# Patient Record
Sex: Female | Born: 1985 | Race: White | Hispanic: No | Marital: Married | Smoking: Never smoker
Health system: Southern US, Community
[De-identification: ages and names within clinical notes are randomized; demographics above are authoritative.]

## PROBLEM LIST (undated history)

## (undated) DIAGNOSIS — R768 Other specified abnormal immunological findings in serum: Secondary | ICD-10-CM

## (undated) HISTORY — DX: Other specified abnormal immunological findings in serum: R76.8

---
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
## 2006-05-26 ENCOUNTER — Emergency Department (HOSPITAL_COMMUNITY): Admission: EM | Admit: 2006-05-26 | Discharge: 2006-05-27 | Payer: Self-pay | Admitting: Emergency Medicine

## 2008-03-12 ENCOUNTER — Other Ambulatory Visit: Admission: RE | Admit: 2008-03-12 | Discharge: 2008-03-12 | Payer: Self-pay | Admitting: Obstetrics and Gynecology

## 2008-03-20 ENCOUNTER — Ambulatory Visit (HOSPITAL_COMMUNITY): Admission: RE | Admit: 2008-03-20 | Discharge: 2008-03-20 | Payer: Self-pay | Admitting: Obstetrics & Gynecology

## 2008-04-10 ENCOUNTER — Ambulatory Visit (HOSPITAL_COMMUNITY): Admission: RE | Admit: 2008-04-10 | Discharge: 2008-04-10 | Payer: Self-pay | Admitting: Obstetrics & Gynecology

## 2008-05-01 ENCOUNTER — Ambulatory Visit (HOSPITAL_COMMUNITY): Admission: RE | Admit: 2008-05-01 | Discharge: 2008-05-01 | Payer: Self-pay | Admitting: Obstetrics & Gynecology

## 2008-06-01 ENCOUNTER — Ambulatory Visit (HOSPITAL_COMMUNITY): Admission: RE | Admit: 2008-06-01 | Discharge: 2008-06-01 | Payer: Self-pay | Admitting: Obstetrics & Gynecology

## 2008-06-15 DIAGNOSIS — R768 Other specified abnormal immunological findings in serum: Secondary | ICD-10-CM

## 2008-06-15 HISTORY — DX: Other specified abnormal immunological findings in serum: R76.8

## 2008-09-22 ENCOUNTER — Inpatient Hospital Stay (HOSPITAL_COMMUNITY): Admission: AD | Admit: 2008-09-22 | Discharge: 2008-09-22 | Payer: Self-pay | Admitting: Obstetrics and Gynecology

## 2008-09-22 ENCOUNTER — Ambulatory Visit: Payer: Self-pay | Admitting: Advanced Practice Midwife

## 2008-10-07 ENCOUNTER — Ambulatory Visit: Payer: Self-pay | Admitting: Obstetrics and Gynecology

## 2008-10-07 ENCOUNTER — Encounter (HOSPITAL_COMMUNITY): Payer: Self-pay | Admitting: Obstetrics and Gynecology

## 2008-10-07 ENCOUNTER — Inpatient Hospital Stay (HOSPITAL_COMMUNITY): Admission: AD | Admit: 2008-10-07 | Discharge: 2008-10-09 | Payer: Self-pay | Admitting: Obstetrics and Gynecology

## 2009-06-23 ENCOUNTER — Emergency Department (HOSPITAL_COMMUNITY): Admission: EM | Admit: 2009-06-23 | Discharge: 2009-06-23 | Payer: Self-pay | Admitting: Emergency Medicine

## 2010-08-12 ENCOUNTER — Emergency Department (HOSPITAL_COMMUNITY): Payer: Self-pay

## 2010-08-12 ENCOUNTER — Encounter (HOSPITAL_COMMUNITY): Payer: Self-pay | Admitting: Radiology

## 2010-08-12 ENCOUNTER — Emergency Department (HOSPITAL_COMMUNITY)
Admission: EM | Admit: 2010-08-12 | Discharge: 2010-08-12 | Disposition: A | Discharge status: Home / Self Care | Payer: Self-pay | Attending: Emergency Medicine | Admitting: Emergency Medicine

## 2010-08-12 DIAGNOSIS — N83209 Unspecified ovarian cyst, unspecified side: Secondary | ICD-10-CM | POA: Insufficient documentation

## 2010-08-12 DIAGNOSIS — R109 Unspecified abdominal pain: Secondary | ICD-10-CM | POA: Insufficient documentation

## 2010-08-12 LAB — CBC
HCT: 36 % (ref 36.0–46.0)
Platelets: 271 10*3/uL (ref 150–400)
RBC: 4.53 MIL/uL (ref 3.87–5.11)
RDW: 13 % (ref 11.5–15.5)
WBC: 7.1 10*3/uL (ref 4.0–10.5)

## 2010-08-12 LAB — URINALYSIS, ROUTINE W REFLEX MICROSCOPIC
Ketones, ur: 15 mg/dL — AB
Leukocytes, UA: NEGATIVE
Nitrite: NEGATIVE
Urine Glucose, Fasting: NEGATIVE mg/dL
pH: 7 (ref 5.0–8.0)

## 2010-08-12 LAB — DIFFERENTIAL
Basophils Absolute: 0 10*3/uL (ref 0.0–0.1)
Basophils Relative: 0 % (ref 0–1)
Eosinophils Relative: 1 % (ref 0–5)
Lymphocytes Relative: 35 % (ref 12–46)
Lymphs Abs: 2.5 10*3/uL (ref 0.7–4.0)
Monocytes Relative: 7 % (ref 3–12)
Neutro Abs: 4 10*3/uL (ref 1.7–7.7)
Neutrophils Relative %: 57 % (ref 43–77)

## 2010-08-12 LAB — URINE MICROSCOPIC-ADD ON

## 2010-08-12 LAB — BASIC METABOLIC PANEL
BUN: 13 mg/dL (ref 6–23)
CO2: 22 mEq/L (ref 19–32)
Creatinine, Ser: 0.8 mg/dL (ref 0.4–1.2)
GFR calc Af Amer: >60 mL/min (ref >60)
Potassium: 3.2 mEq/L — ABNORMAL LOW (ref 3.5–5.1)

## 2010-08-12 MED ORDER — IOHEXOL 300 MG/ML  SOLN
100.0000 mL | Freq: Once | INTRAMUSCULAR | Status: AC | PRN
  Administered 2010-08-12: 100 mL via INTRAVENOUS

## 2010-08-13 IMAGING — US US OB DETAIL+14 WK
1 series · 18 of 28 positions shown · non-contrast
Comparison: none

OBSTETRICAL ULTRASOUND:
 This ultrasound was performed in The [HOSPITAL], and the [facility] report will be stored to [REDACTED] PACS.

[Series 1: us ob detail+14 wk · 90 acquisitions, 18 frames shown]
[im 1/90]
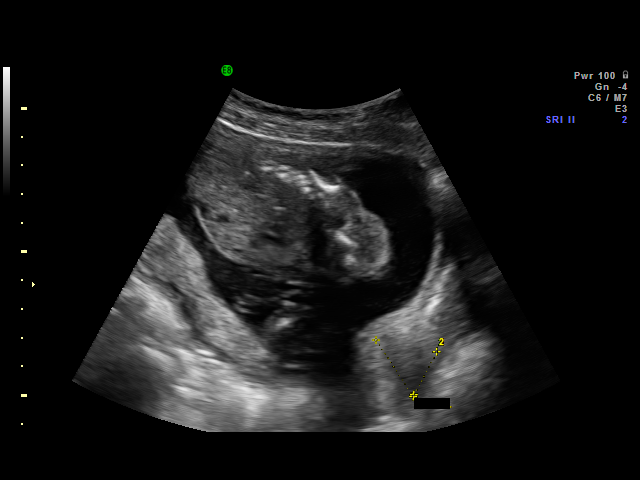
[im 7/90]
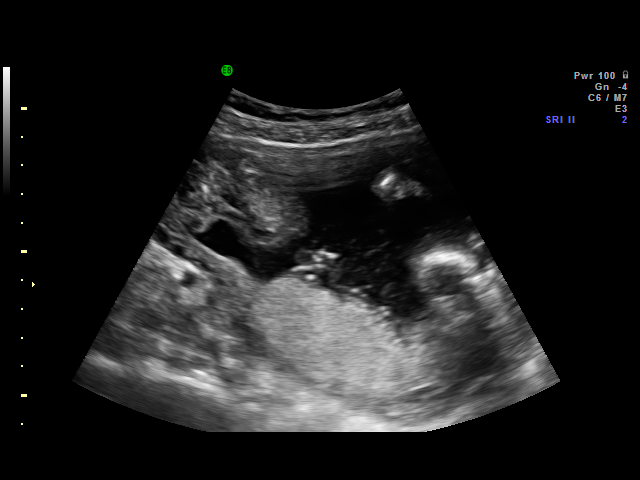
[im 10/90]
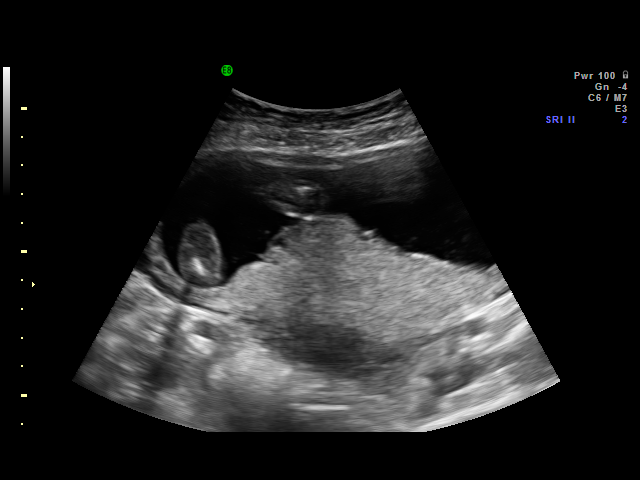
[im 17/90]
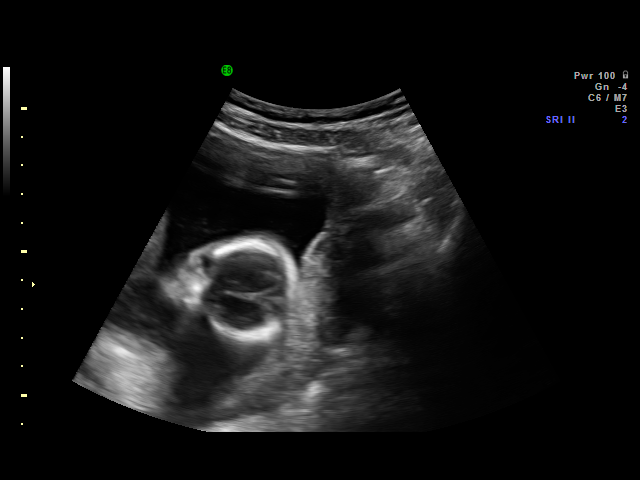
[im 24/90]
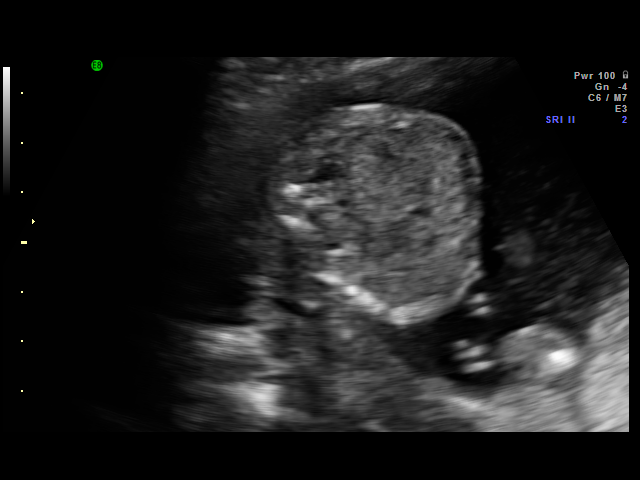
[im 27/90]
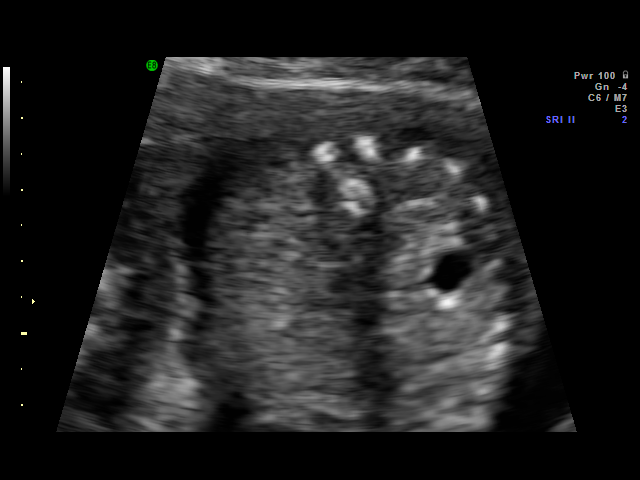
[im 33/90]
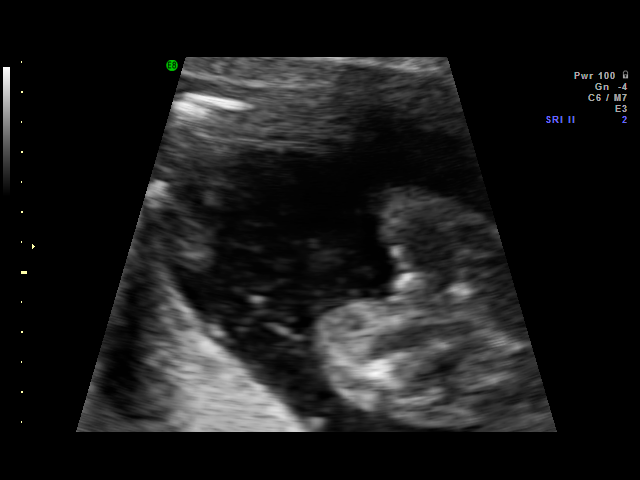
[im 37/90]
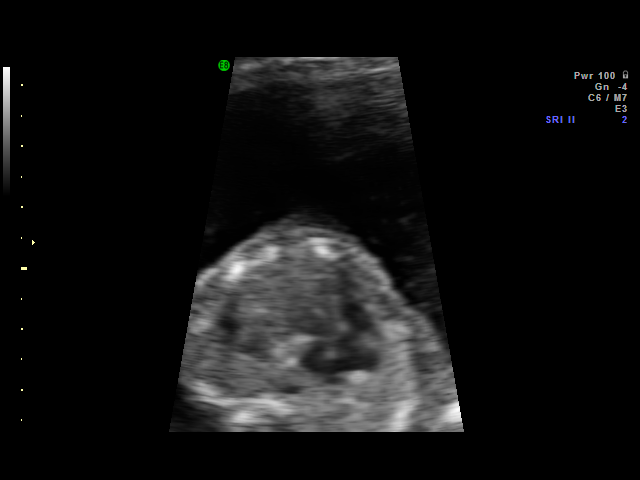
[im 43/90]
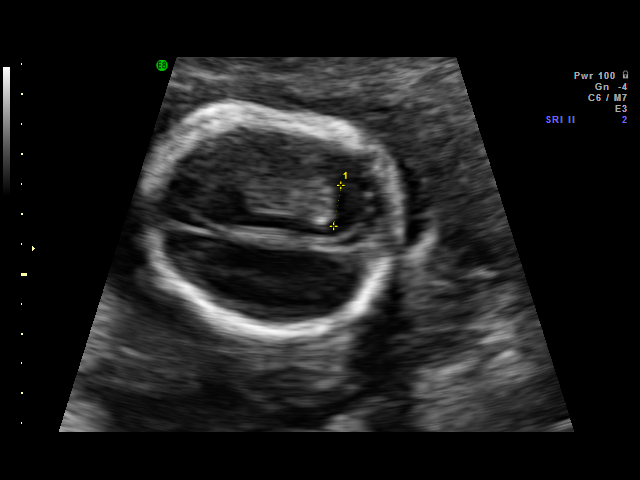
[im 47/90]
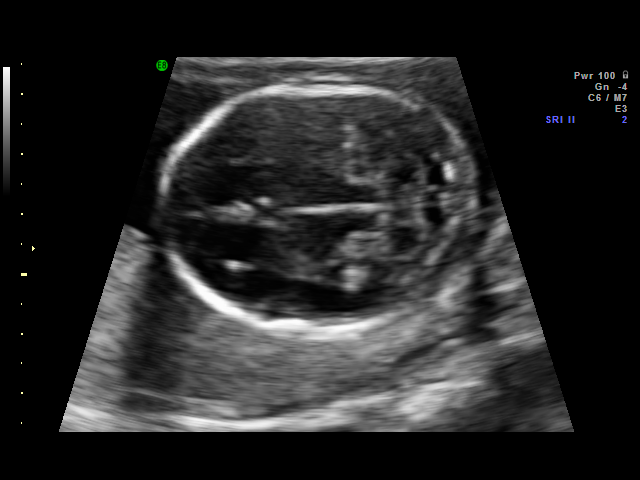
[im 53/90]
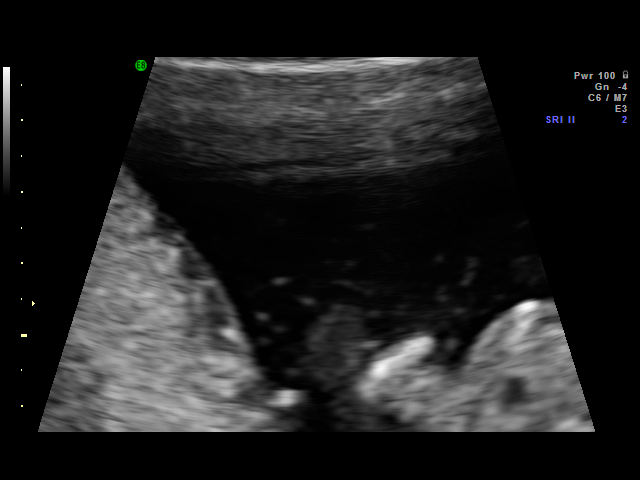
[im 57/90]
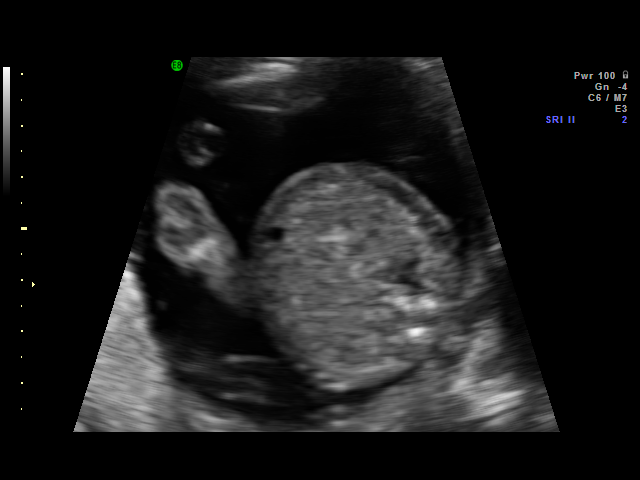
[im 63/90]
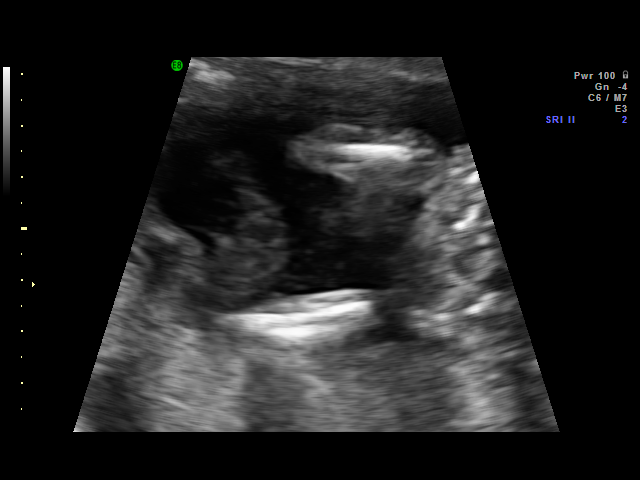
[im 70/90]
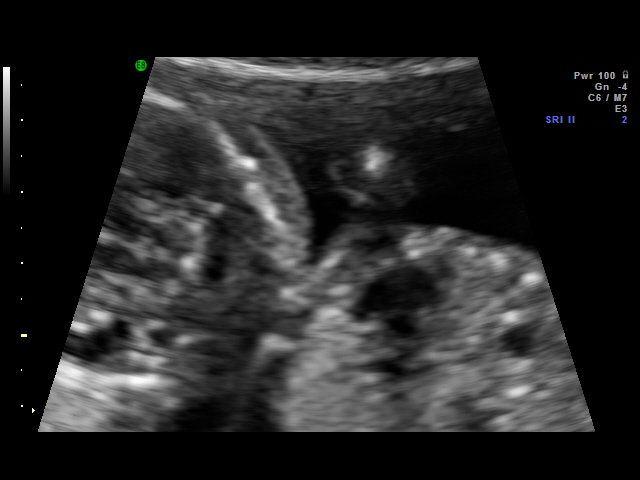
[im 73/90]
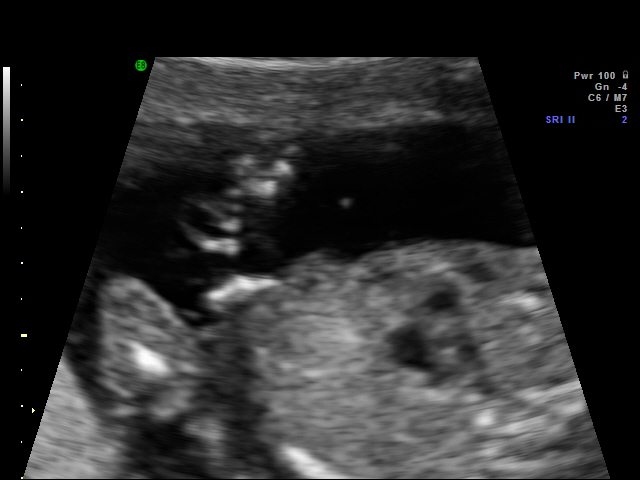
[im 80/90]
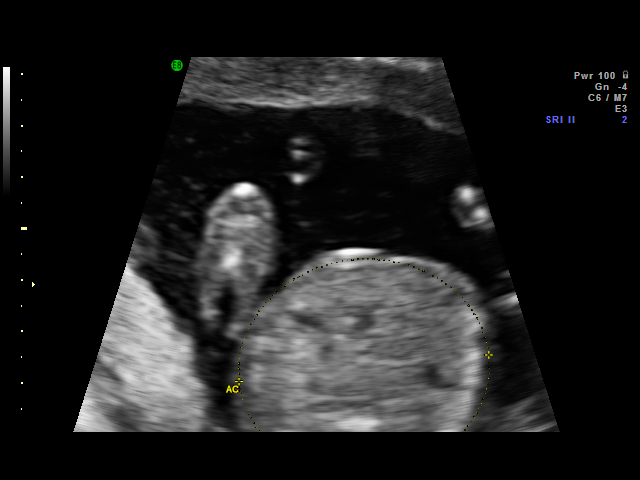
[im 83/90]
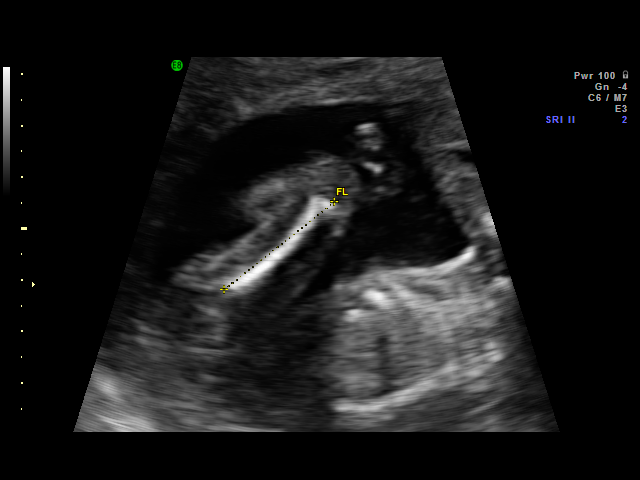
[im 90/90]
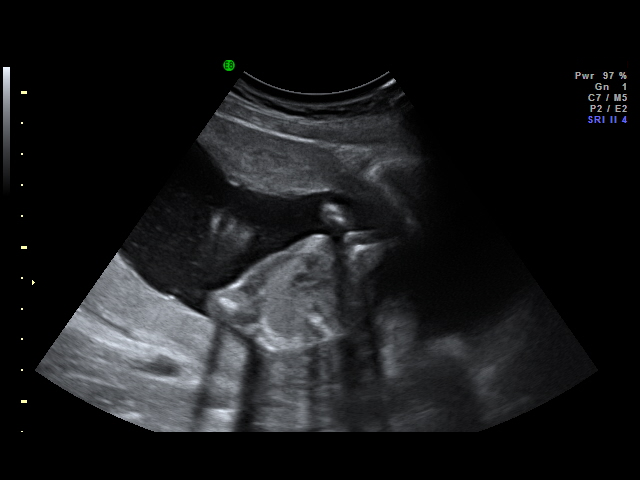

[18 of 28 positions shown; findings below may reference images not displayed]

IMPRESSION: [facility] has also been faxed to the ordering physician.

## 2010-08-31 LAB — RAPID STREP SCREEN (MED CTR MEBANE ONLY): Streptococcus, Group A Screen (Direct): NEGATIVE

## 2010-09-24 LAB — CBC
Hemoglobin: 10 g/dL — ABNORMAL LOW (ref 12.0–15.0)
MCHC: 34.1 g/dL (ref 30.0–36.0)
MCV: 78.3 fL (ref 78.0–100.0)
Platelets: 156 10*3/uL (ref 150–400)
Platelets: 190 10*3/uL (ref 150–400)
RBC: 3.08 MIL/uL — ABNORMAL LOW (ref 3.87–5.11)
WBC: 11.6 10*3/uL — ABNORMAL HIGH (ref 4.0–10.5)
WBC: 12.9 10*3/uL — ABNORMAL HIGH (ref 4.0–10.5)

## 2010-10-31 NOTE — Op Note (Signed)
Paula Sims, Paula Sims                        ACCOUNT NO.:  [id number]   MEDICAL RECORD NO.:  [id number]                   PATIENT TYPE:  AMB   LOCATION:  DAY                                  FACILITY:  [facility]   PHYSICIAN:  Oley Balm. Pricilla Holm, D.P.M.             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   PROCEDURE:  IP fusion second digits bilaterally.   SURGEON:  Oley Balm. Pricilla Holm, D.P.M.   ANESTHESIA:  Local, monitored anesthesia care   INDICATIONS FOR SURGERY:  Long-standing history of discomfort, inability to  wear enclosed shoes.   DESCRIPTION OF PROCEDURE:  The patient brought to the operating room and  placed on the operating table in the supine position  The patient's lower  feet and legs were then prepped and draped in the usual aseptic manner.  Then, with an ankle tourniquet placed and well-padded to prevent contusion;  elevated to 250 mmHg and after exsanguination of the each foot the following  surgical procedures were then performed under local, stand-by, anesthesia.   IP FUSION SECOND DIGIT RIGHT FOOT.  Attention was directed to the dorsal  aspect of the second digit where 2 semi-elliptical converting skin incisions  were made.  The incision was widened and deepened via sharp and blunt  dissection being sure to identify and retract all vital structures.  A  capsular incision made at the head of the phalanx __________ soft tissue  attachments dorsally, medially, laterally and plantarly.  Utilizing a Zimmer  oscillating saw the head of the proximal phalanx was fashioned into a female  component and a drill hole was drilled into the middle phalanx of the female  component.  The female component  was placed into the female component.  This  had the effect of fusing the IP joint as well as shortening the second toe.   A 0.045 K-wire was retrograded from distal to proximal.  It was noted that  the osteotomy and the fusion site was stable.  The wound was lavaged with a  copious amount of sterile saline.  The extensor tendon was reapproximated  utilizing 1 horizontal mattress suture of 4-0 Dexon.  The skin was  approximated utilizing horizontal mattress sutures of 4-0 Prolene.   IP FUSION WITH SHORTENING SECOND DIGIT LEFT FOOT.  A similar procedure that  was performed on the right foot was performed on the left foot with no  addition or deletions.   All surgical sites were then infiltrated with approximately 1/8 cc of  dexamethasone phosphate and a mild compressive bandage consisting of  Betadine soaked Adaptic, sterile 4 x 4's, and sterile Kling was applied.  The  patient tolerated the procedure well and left the operating room in  apparent good condition with vital signs stable to the recovery room.                                               Oley Balm Pricilla Holm, D.P.M.    DBT/MEDQ  D:  03/20/2002  T:  03/21/2002  Job:  [id number]

## 2010-10-31 NOTE — H&P (Signed)
   Paula Sims, Paula Sims                        ACCOUNT NO.:  [id number]   MEDICAL RECORD NO.:  [id number]                   PATIENT TYPE:  AMB   LOCATION:  DAY                                  FACILITY:  [facility]   PHYSICIAN:  Oley Balm. Pricilla Holm, D.P.M.             DATE OF BIRTH:  01-20-1986   DATE OF ADMISSION:  03/20/2002  DATE OF DISCHARGE:                                HISTORY & PHYSICAL   HISTORY OF PRESENT ILLNESS:  The patient is a 25 year old white female that  has had a longstanding history of pain from the distal ends of both of her  second toes.  The patient has a lesion on the distal end of each second  digit because of the length of the digit and of a mild hammertoe deformity.  She has difficulty wearing enclosed shoes and the mother requests surgical  correction of same.   PREVIOUS HOSPITALIZATIONS AND SURGERY:  None.   MEDICATIONS:  None.   ALLERGIES:  No allergies.   TRANSFUSIONS:  No transfusions or hepatitis.   REVIEW OF SYSTEMS:  Uneventful.   PHYSICAL EXAMINATION:  EXTREMITIES:  Lower extremity exam reveals palpable  pedal pulses, both DP and PT, with spontaneous capillary filling time.  NEUROLOGICAL:  Exam essentially within normal limits.  MUSCULOSKELETAL:  Exam reveals exceptionally long second digits bilaterally  with contraction at the IP joints and at the DIPJs.  There is a lesion on  the distal end of each digit.   ASSESSMENT:  Long digits with mild hammertoe deformity.   PLAN:  I reviewed both conservative and surgical management but again, the  patient's mother and the patient would like to have the problem surgically  corrected.  I reviewed with them IP fusion with shortening osteotomy.  I  reviewed the procedures, the complications of the procedure such as  infection, bone infection, postoperative pain, swelling, etc.  The patient  seems to understand the same and again, surgery has been scheduled for  March 20, 2002.                          Oley Balm Pricilla Holm, D.P.M.    DBT/MEDQ  D:  03/19/2002  T:  03/20/2002  Job:  [id number]

## 2011-03-22 ENCOUNTER — Other Ambulatory Visit: Payer: Self-pay | Admitting: Advanced Practice Midwife

## 2011-06-16 HISTORY — PX: FOOT SURGERY: SHX648

## 2011-07-31 ENCOUNTER — Other Ambulatory Visit: Payer: Self-pay | Admitting: Advanced Practice Midwife

## 2011-10-06 ENCOUNTER — Other Ambulatory Visit: Payer: Self-pay | Admitting: Obstetrics & Gynecology

## 2012-01-20 ENCOUNTER — Other Ambulatory Visit: Payer: Self-pay | Admitting: Obstetrics & Gynecology

## 2012-11-23 IMAGING — CT CT ABD-PELV W/ CM
2 of 3 series · 16 of 46 positions shown, 18 images · IV contrast (Omnipaque 300)
Comparison: None.

***ADDENDUM*** CREATED: 08/12/2010 [DATE]

On further review, there is a punctate calculus in the dependent
left bladder (series 2/image 68).  Focal right ureteral dilatation
is likely secondary to recent passage of this calculus.
***END ADDENDUM*** SIGNED BY: Armita Tobben, M.D.
CLINICAL DATA: Right lower quadrant abdominal pain starting last
night.
CT ABDOMEN AND PELVIS WITH CONTRAST
TECHNIQUE: Multidetector CT imaging of the abdomen and pelvis was
performed following the standard protocol during bolus
administration of intravenous contrast.
Contrast: 100 ml 2mnipaque-RLL IV

[Series 2: abd_pel_with 5.0 b40f · axial · 0.63mm/px · z∈[-443,-83]mm · 13 of 84 slices shown, 15 images]
[im 6/84  soft-tissue]
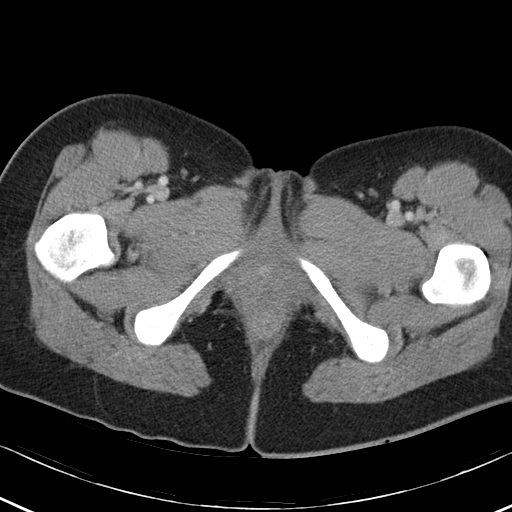
[im 6/84  bone]
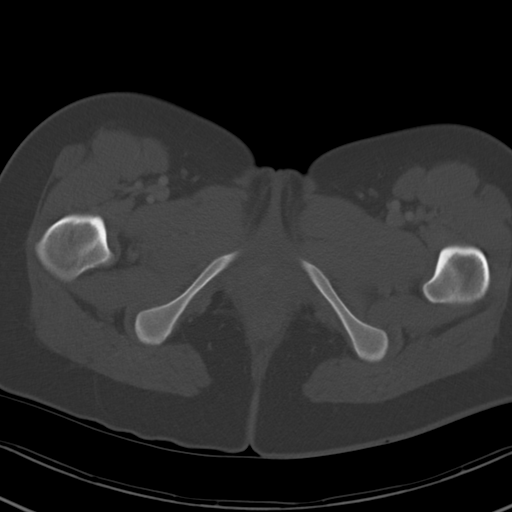
[im 11/84  soft-tissue]
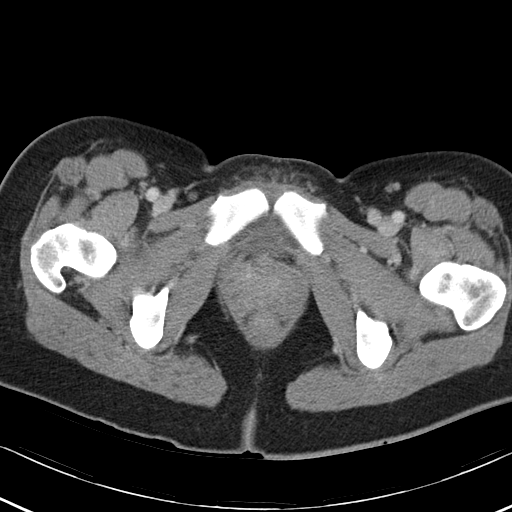
[im 17/84  soft-tissue]
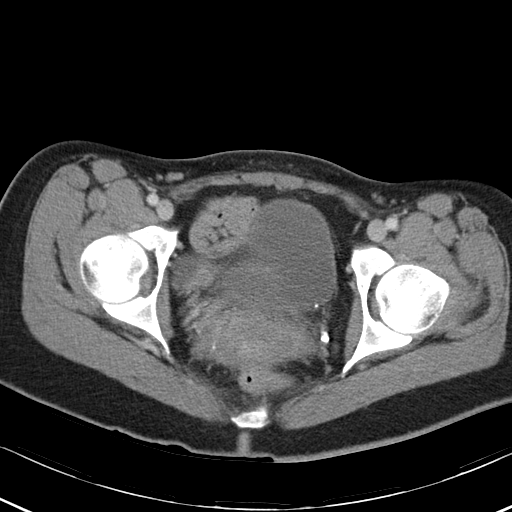
[im 25/84  soft-tissue]
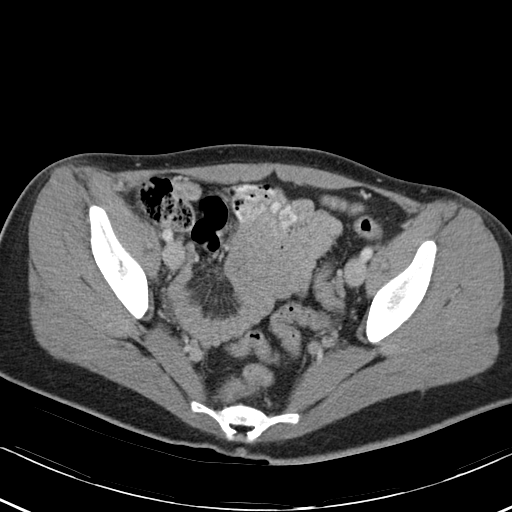
[im 30/84  soft-tissue]
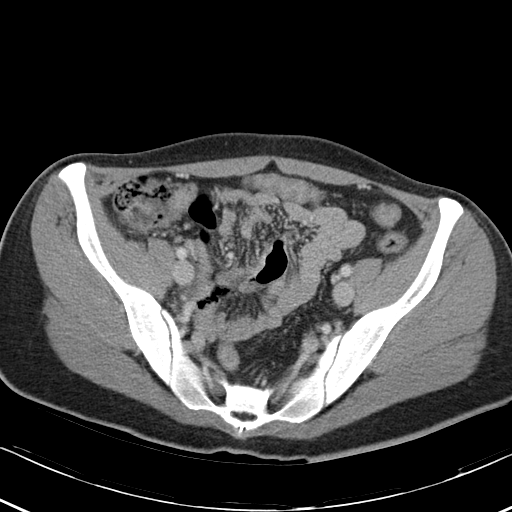
[im 35/84  soft-tissue]
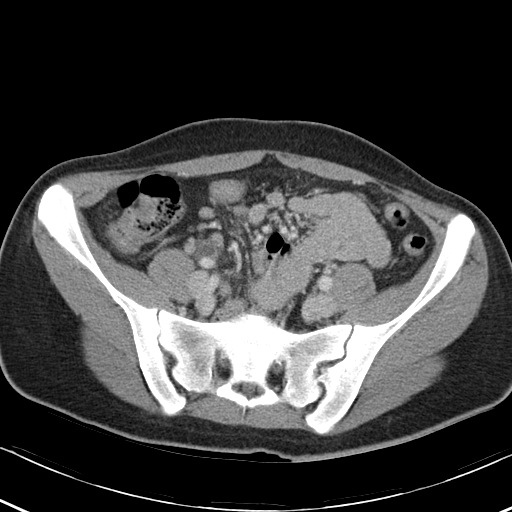
[im 43/84  soft-tissue]
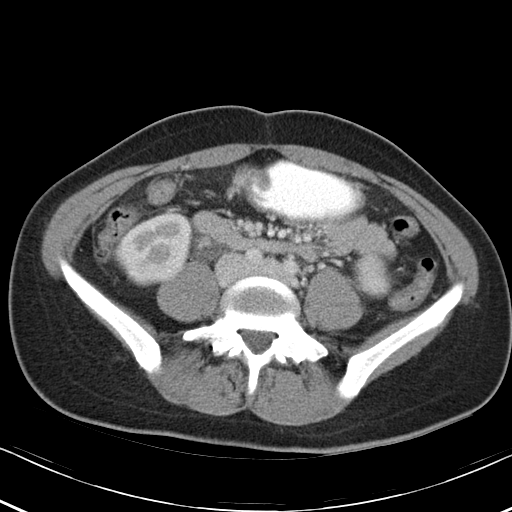
[im 49/84  soft-tissue]
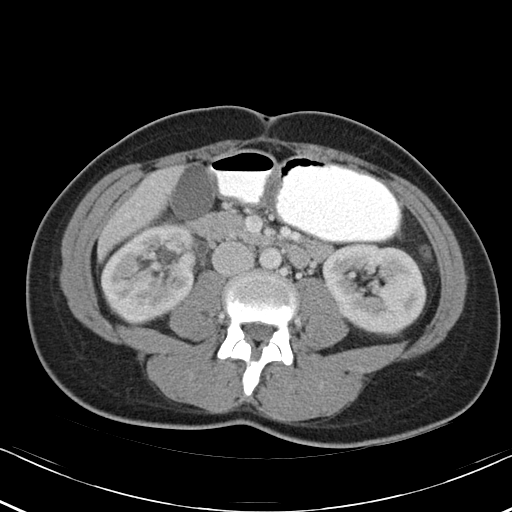
[im 54/84  soft-tissue]
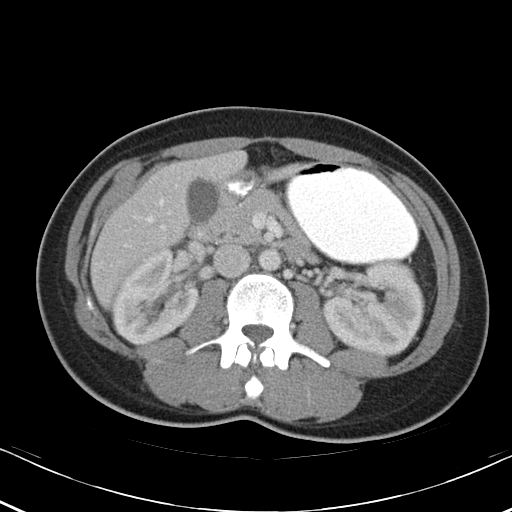
[im 54/84  bone]
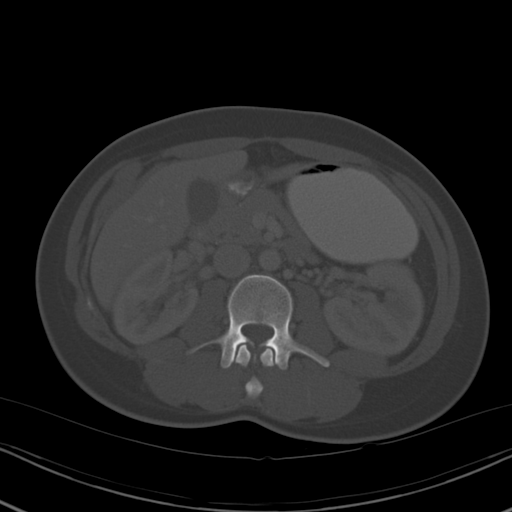
[im 59/84  soft-tissue]
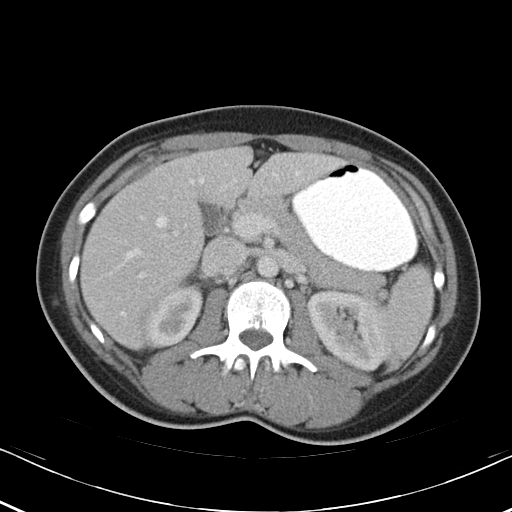
[im 67/84  soft-tissue]
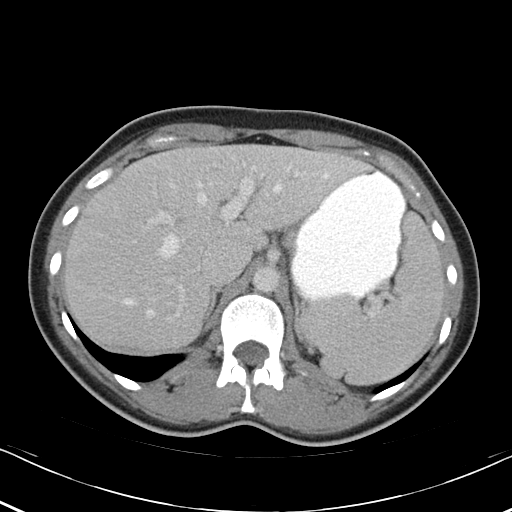
[im 73/84  soft-tissue]
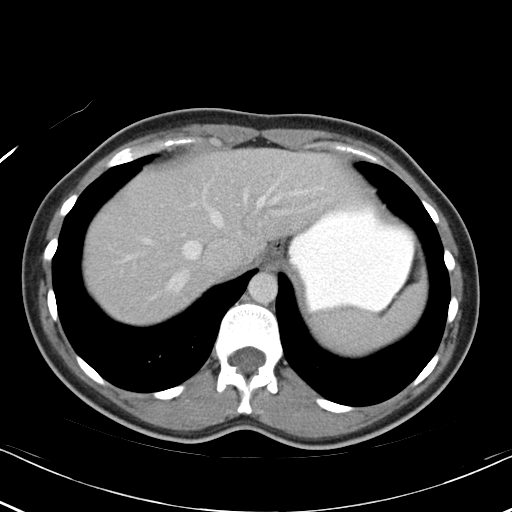
[im 78/84  soft-tissue]
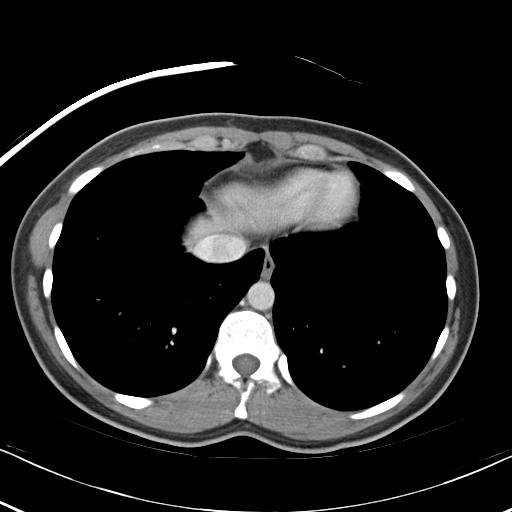

[Series 4: abd_pel_with 3.0 spo cor · coronal · 0.63mm/px · 3 of 76 slices shown]
[im 26/76  soft-tissue]
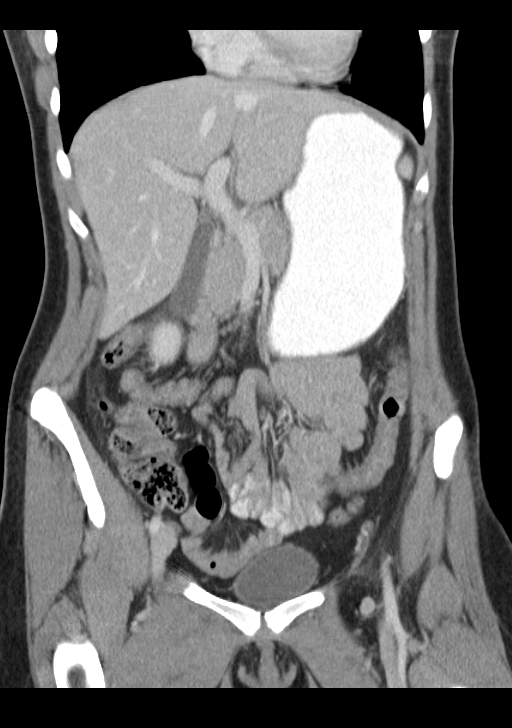
[im 34/76  soft-tissue]
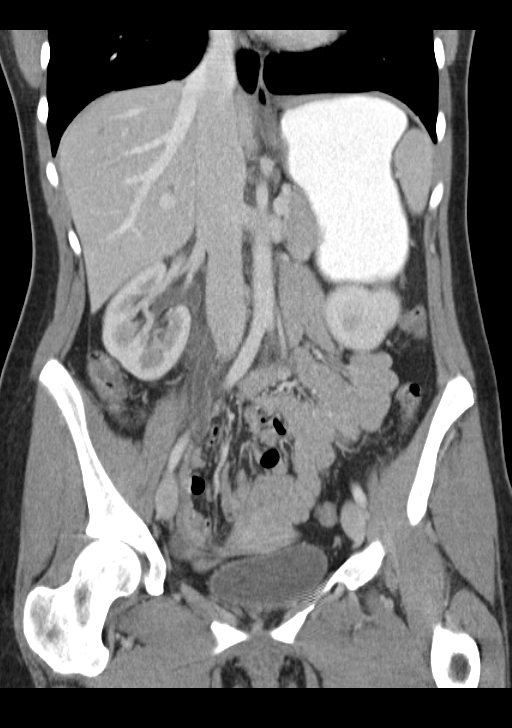
[im 42/76  soft-tissue]
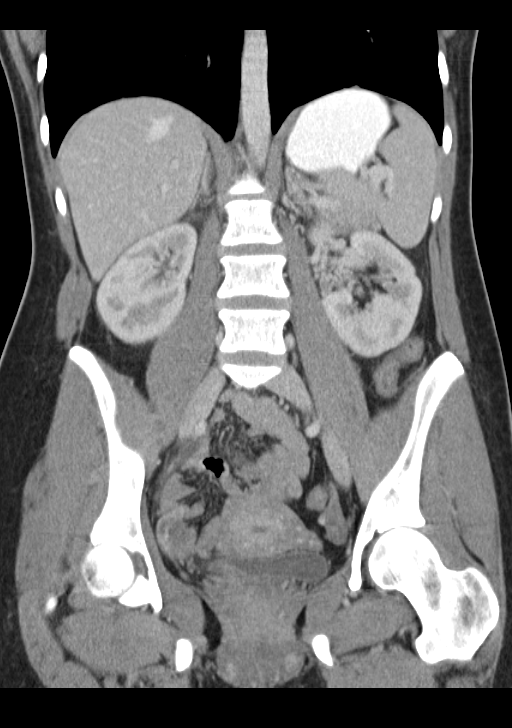

[16 of 46 positions shown; findings below may reference images not displayed]

FINDINGS: The lung bases are clear.

Focal fat along the falciform ligament.  No suspicious hepatic
lesions.  Spleen, pancreas, gallbladder, and bilateral adrenal
glands are within normal limits.

Bilateral kidneys are within normal limits, without hydronephrosis.

No evidence of bowel obstruction. Normal appendix.

Small amount of dependent pelvic fluid, likely physiologic.

Uterus and bilateral ovaries are within normal limits, noting a
cm right ovarian cyst, likely physiologic.

Mild focal dilatation of the mid right ureter (series 2/image 47),
without evidence of calculi or obstructing lesion. Bladder is
unremarkable.

Visualized osseous structures are within normal limits, noting
partial sacralization of the left L5 vertebral body.
IMPRESSION: Normal appendix. Normal gallbladder.  No evidence of obstruction.

No CT findings to account for the patient's abdominal pain.

## 2013-04-26 ENCOUNTER — Telehealth: Payer: Self-pay | Admitting: Advanced Practice Midwife

## 2013-04-27 ENCOUNTER — Encounter: Payer: Self-pay | Admitting: Advanced Practice Midwife

## 2013-04-27 ENCOUNTER — Other Ambulatory Visit: Payer: Self-pay | Admitting: Advanced Practice Midwife

## 2013-04-27 DIAGNOSIS — N979 Female infertility, unspecified: Secondary | ICD-10-CM | POA: Insufficient documentation

## 2013-04-27 DIAGNOSIS — R768 Other specified abnormal immunological findings in serum: Secondary | ICD-10-CM | POA: Insufficient documentation

## 2013-04-27 MED ORDER — CLOMIPHENE CITRATE 50 MG PO TABS: 50.0000 mg | ORAL_TABLET | Freq: Every day | ORAL | Status: DC

## 2013-04-27 NOTE — Progress Notes (Signed)
Has been actively (using ovulation predictor tests) for 2 years to get pregnant.  Had a first trimester SAB 2/12.  Ovulates every month.  Discussed with Dr. Despina Hidden.  Will try Clomid.  Pt researched it and is aware of potential side effects and risks (multiples, mood swings, etc).  Clomid 50mg  PO day 3-7 of cycle.

## 2013-04-27 NOTE — Telephone Encounter (Signed)
Drenda Freeze took care of.

## 2013-06-15 HISTORY — PX: OTHER SURGICAL HISTORY: SHX169

## 2014-04-16 ENCOUNTER — Encounter: Payer: Self-pay | Admitting: Advanced Practice Midwife

## 2014-07-16 ENCOUNTER — Telehealth: Payer: Self-pay | Admitting: Advanced Practice Midwife

## 2014-07-16 NOTE — Telephone Encounter (Signed)
Pt states she had saw Jack QuartoFran Sims, CNM in the past for infertility was started on Clomid with no success, requesting a referral for a infertility specialist.

## 2014-07-18 ENCOUNTER — Telehealth: Payer: Self-pay | Admitting: Advanced Practice Midwife

## 2014-07-18 NOTE — Telephone Encounter (Signed)
[facility] [place].  [phone]

## 2015-06-16 NOTE — L&D Delivery Note (Signed)
Delivery Note After a 9  Minute 2nd stage, at 11:51 PM a viable female was delivered via Vaginal, Breech (Presentation: Right Occiput Anterior).  APGAR: , 9/9; weight pending  .   After 2 minutes, the cord was clamped and cut. 40 units of pitocin diluted in 1000cc LR was infused rapidly IV.  The placenta separated spontaneously and delivered via CCT and maternal pushing effort.  It was inspected and appears to be intact with a 3 VC.    Anesthesia: Epidural  Episiotomy: None Lacerations: None Suture Repair: n/a Est. Blood Loss (mL): 32  Mom to postpartum.  Baby to Couplet care / Skin to Skin.  CRESENZO-DISHMAN,Bayard More 12/13/2015, 12:07 AM

## 2015-06-18 ENCOUNTER — Other Ambulatory Visit: Payer: Self-pay | Admitting: Obstetrics and Gynecology

## 2015-06-18 DIAGNOSIS — O3680X0 Pregnancy with inconclusive fetal viability, not applicable or unspecified: Secondary | ICD-10-CM

## 2015-06-20 ENCOUNTER — Ambulatory Visit (INDEPENDENT_AMBULATORY_CARE_PROVIDER_SITE_OTHER): Payer: BLUE CROSS/BLUE SHIELD | Admitting: Advanced Practice Midwife

## 2015-06-20 ENCOUNTER — Encounter: Payer: Self-pay | Admitting: Advanced Practice Midwife

## 2015-06-20 ENCOUNTER — Other Ambulatory Visit (HOSPITAL_COMMUNITY)
Admission: RE | Admit: 2015-06-20 | Discharge: 2015-06-20 | Disposition: A | Discharge status: Home / Self Care | Payer: Self-pay | Source: Ambulatory Visit | Attending: Obstetrics and Gynecology | Admitting: Obstetrics and Gynecology

## 2015-06-20 ENCOUNTER — Ambulatory Visit (INDEPENDENT_AMBULATORY_CARE_PROVIDER_SITE_OTHER): Payer: BLUE CROSS/BLUE SHIELD

## 2015-06-20 ENCOUNTER — Other Ambulatory Visit: Payer: Self-pay | Admitting: Obstetrics and Gynecology

## 2015-06-20 VITALS — BP 104/60 | HR 68 | Ht 64.0 in | Wt 173.0 lb

## 2015-06-20 DIAGNOSIS — O3491 Maternal care for abnormality of pelvic organ, unspecified, first trimester: Secondary | ICD-10-CM | POA: Diagnosis not present

## 2015-06-20 DIAGNOSIS — Z124 Encounter for screening for malignant neoplasm of cervix: Secondary | ICD-10-CM

## 2015-06-20 DIAGNOSIS — O3680X0 Pregnancy with inconclusive fetal viability, not applicable or unspecified: Secondary | ICD-10-CM

## 2015-06-20 DIAGNOSIS — Z369 Encounter for antenatal screening, unspecified: Secondary | ICD-10-CM

## 2015-06-20 DIAGNOSIS — Z01419 Encounter for gynecological examination (general) (routine) without abnormal findings: Secondary | ICD-10-CM | POA: Insufficient documentation

## 2015-06-20 DIAGNOSIS — Z0283 Encounter for blood-alcohol and blood-drug test: Secondary | ICD-10-CM

## 2015-06-20 DIAGNOSIS — O09811 Supervision of pregnancy resulting from assisted reproductive technology, first trimester: Secondary | ICD-10-CM

## 2015-06-20 DIAGNOSIS — Z3491 Encounter for supervision of normal pregnancy, unspecified, first trimester: Secondary | ICD-10-CM

## 2015-06-20 DIAGNOSIS — Z3A13 13 weeks gestation of pregnancy: Secondary | ICD-10-CM

## 2015-06-20 DIAGNOSIS — Z3682 Encounter for antenatal screening for nuchal translucency: Secondary | ICD-10-CM

## 2015-06-20 DIAGNOSIS — Z1389 Encounter for screening for other disorder: Secondary | ICD-10-CM

## 2015-06-20 DIAGNOSIS — O0901 Supervision of pregnancy with history of infertility, first trimester: Secondary | ICD-10-CM

## 2015-06-20 DIAGNOSIS — O0991 Supervision of high risk pregnancy, unspecified, first trimester: Secondary | ICD-10-CM

## 2015-06-20 DIAGNOSIS — N979 Female infertility, unspecified: Secondary | ICD-10-CM

## 2015-06-20 DIAGNOSIS — Z3490 Encounter for supervision of normal pregnancy, unspecified, unspecified trimester: Secondary | ICD-10-CM | POA: Insufficient documentation

## 2015-06-20 DIAGNOSIS — R768 Other specified abnormal immunological findings in serum: Secondary | ICD-10-CM

## 2015-06-20 DIAGNOSIS — Z331 Pregnant state, incidental: Secondary | ICD-10-CM

## 2015-06-20 LAB — POCT URINALYSIS DIPSTICK
GLUCOSE UA: NEGATIVE
Ketones, UA: NEGATIVE
Leukocytes, UA: NEGATIVE
NITRITE UA: NEGATIVE
Protein, UA: NEGATIVE
RBC UA: NEGATIVE

## 2015-06-20 MED ORDER — PRENATE MINI 18-0.6-0.4-350 MG PO CAPS: 1.0000 | ORAL_CAPSULE | Freq: Every day | ORAL | Status: DC

## 2015-06-20 NOTE — Progress Notes (Signed)
  Subjective:    Paula BeetsKayleigh G Montemurro is a [id number] 3738w1d being seen today for her first obstetrical visit.  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 This pregnancy is a result of her 3rd IUI (2nd one ended in SAB). .  Pregnancy history fully reviewed.  Patient reports no complaints.   Filed Vitals:   06/20/15 1411 06/20/15 1412  BP: 104/60   Pulse: 68   Height:  5\' 4"  (1.626 m)  Weight: 78.472 kg (173 lb)     HISTORY: OB History  Gravida Para Term Preterm AB SAB TAB Ectopic Multiple Living  3 1 1  1 1    1     # Outcome Date GA Lbr Len/2nd Weight Sex Delivery Anes PTL Lv  3 Current           2 SAB 2012 5133w0d         1 Term 10/07/08   3.884 kg (8 lb 9 oz) F Vag-Spont EPI N Y     Past Medical History  Diagnosis Date  . Seropositive for herpes simplex 2 infection 2010   Past Surgical History  Procedure Laterality Date  . Eye implants  2015  . Foot surgery  2013   Family History  Problem Relation Age of Onset  . Hypertension Father   . Hypertension Mother   . Hypothyroidism Mother   . Hypothyroidism Sister   . Hypertension Maternal Grandmother   . Cancer Maternal Grandmother   . Hypothyroidism Maternal Grandmother   . Hypertension Maternal Grandfather   . Hypertension Paternal Grandmother   . Hypertension Paternal Grandfather      Exam       Pelvic Exam:    Perineum: Normal Perineum   Vulva: normal   Vagina:  normal mucosa, normal discharge, no palpable nodules   Uterus Normal, Gravid, FH: 12     Cervix: normal   Adnexa: Not palpable   Urinary:  urethral meatus normal    System:     Skin: normal coloration and turgor, no rashes    Neurologic: oriented, normal, normal mood   Extremities: normal strength, tone, and muscle mass   HEENT PERRLA   Mouth/Teeth mucous membranes moist, normal dentition   Neck supple and no masses   Cardiovascular: regular rate and rhythm   Respiratory:  appears well, vitals normal, no respiratory  distress, acyanotic   Abdomen: soft, non-tender;  FHR: 150 US       Had NT/IT today:  US 13+2wks,single IUP pos fht 155 bpm,NB present,NT 2.6210mm,crl 71.4cm, simple rt cl cyst 2.8 x 2.3 x 3.2 cm,normal lt ov         EDC will not change because pt had IUI and a 6 week US that confirms EDC of 01/01/16    Assessment:    Pregnancy: G3P1011 Patient Active Problem List   Diagnosis Date Noted  . Supervision of normal pregnancy 06/20/2015  . 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 Seropositive for herpes simplex 2 infection         Plan:    (CCNC not done) Initial labs drawn. Continue prenatal vitamins  Problem list reviewed and updated  Reviewed n/v relief measures and warning s/s to report  Reviewed recommended weight gain based on pre-gravid BMI  Encouraged well-balanced diet Genetic Screening discussed Integrated Screen: requested.  Ultrasound discussed; fetal survey: requested.  Return in about 4 weeks (around 07/18/2015) for LROB. and 2nd IT  CRESENZO-DISHMAN,Bartolo Montanye 06/20/2015

## 2015-06-20 NOTE — Progress Notes (Signed)
US 13+2wks,single IUP pos fht 155 bpm,NB present,NT 2.2710mm,crl 71.4cm, simple rt cl cyst 2.8 x 2.3 x 3.2 cm,normal lt ov

## 2015-06-21 LAB — URINE CULTURE: ORGANISM ID, BACTERIA: NO GROWTH

## 2015-06-23 LAB — MATERNAL SCREEN, INTEGRATED #1
CROWN RUMP LENGTH MAT SCREEN: 71.4 mm
GEST. AGE ON COLLECTION DATE: 13 wk
MATERNAL AGE AT EDD: 29.2 a
NUMBER OF FETUSES: 1
Nuchal Translucency (NT): 2.1 mm
PAPP-A Value: 564.6 ng/mL
WEIGHT: 173 [lb_av]

## 2015-06-25 LAB — CYTOLOGY - PAP

## 2015-06-29 LAB — PMP SCREEN PROFILE (10S), URINE
Amphetamine Screen, Ur: NEGATIVE ng/mL
BENZODIAZEPINE SCREEN, URINE: NEGATIVE ng/mL
Barbiturate Screen, Ur: NEGATIVE ng/mL
CANNABINOIDS UR QL SCN: NEGATIVE ng/mL
COCAINE(METAB.) SCREEN, URINE: NEGATIVE ng/mL
Creatinine(Crt), U: 127.9 mg/dL (ref 20.0–300.0)
Methadone Scn, Ur: NEGATIVE ng/mL
OPIATE SCRN UR: NEGATIVE ng/mL
OXYCODONE+OXYMORPHONE UR QL SCN: NEGATIVE ng/mL
PCP Scrn, Ur: NEGATIVE ng/mL
Ph of Urine: 6.1 (ref 4.5–8.9)
Propoxyphene, Screen: NEGATIVE ng/mL

## 2015-06-29 LAB — CBC
HEMATOCRIT: 32.7 % — AB (ref 34.0–46.6)
HEMOGLOBIN: 10.8 g/dL — AB (ref 11.1–15.9)
MCH: 26.6 pg (ref 26.6–33.0)
MCHC: 33 g/dL (ref 31.5–35.7)
MCV: 81 fL (ref 79–97)
Platelets: 226 10*3/uL (ref 150–379)
RBC: 4.06 x10E6/uL (ref 3.77–5.28)
RDW: 13.5 % (ref 12.3–15.4)
WBC: 8.9 10*3/uL (ref 3.4–10.8)

## 2015-06-29 LAB — MICROSCOPIC EXAMINATION: CASTS: NONE SEEN /LPF

## 2015-06-29 LAB — ANTIBODY SCREEN: ANTIBODY SCREEN: NEGATIVE

## 2015-06-29 LAB — URINALYSIS, ROUTINE W REFLEX MICROSCOPIC
BILIRUBIN UA: NEGATIVE
Glucose, UA: NEGATIVE
KETONES UA: NEGATIVE
NITRITE UA: NEGATIVE
PH UA: 6 (ref 5.0–7.5)
Protein, UA: NEGATIVE
RBC UA: NEGATIVE
SPEC GRAV UA: 1.025 (ref 1.005–1.030)
Urobilinogen, Ur: 0.2 mg/dL (ref 0.2–1.0)

## 2015-06-29 LAB — CYSTIC FIBROSIS MUTATION 97: GENE DIS ANAL CARRIER INTERP BLD/T-IMP: NOT DETECTED

## 2015-06-29 LAB — RPR: RPR: NONREACTIVE

## 2015-06-29 LAB — ABO/RH: RH TYPE: NEGATIVE

## 2015-06-29 LAB — HEPATITIS B SURFACE ANTIGEN: HEP B S AG: NEGATIVE

## 2015-06-29 LAB — HIV ANTIBODY (ROUTINE TESTING W REFLEX): HIV SCREEN 4TH GENERATION: NONREACTIVE

## 2015-06-29 LAB — VARICELLA ZOSTER ANTIBODY, IGG: VARICELLA: 1086 {index} (ref >165)

## 2015-06-29 LAB — SICKLE CELL SCREEN: SICKLE CELL SCREEN: NEGATIVE

## 2015-06-29 LAB — RUBELLA SCREEN: RUBELLA: 1.87 {index} (ref >0.99)

## 2015-07-11 ENCOUNTER — Encounter: Payer: Self-pay | Admitting: Obstetrics and Gynecology

## 2015-07-11 ENCOUNTER — Ambulatory Visit (INDEPENDENT_AMBULATORY_CARE_PROVIDER_SITE_OTHER): Payer: BLUE CROSS/BLUE SHIELD | Admitting: Obstetrics and Gynecology

## 2015-07-11 VITALS — BP 122/74 | HR 66 | Wt 173.5 lb

## 2015-07-11 DIAGNOSIS — Z3492 Encounter for supervision of normal pregnancy, unspecified, second trimester: Secondary | ICD-10-CM

## 2015-07-11 DIAGNOSIS — Z331 Pregnant state, incidental: Secondary | ICD-10-CM

## 2015-07-11 DIAGNOSIS — Z1389 Encounter for screening for other disorder: Secondary | ICD-10-CM

## 2015-07-11 LAB — POCT URINALYSIS DIPSTICK
Glucose, UA: NEGATIVE
KETONES UA: NEGATIVE
Leukocytes, UA: NEGATIVE
Nitrite, UA: NEGATIVE
Protein, UA: NEGATIVE
RBC UA: NEGATIVE

## 2015-07-11 NOTE — Progress Notes (Signed)
Patient ID: Paula Sims, female   DOB: 12/06/1985, 30 y.o.   MRN: [id number]  [id number] [redacted]w[redacted]d Estimated Date of Delivery: 01/01/16  Blood pressure 122/74, pulse 66, weight 173 lb 8 oz (78.699 kg), last menstrual period 03/27/2015.   refer to the ob flow sheet for FH and FHR, also BP, Wt, Urine results:notable for negative  Patient reports  Too early for fetal movement, denies any bleeding and no rupture of membranes symptoms or regular contractions.  Patient complaints: none. She reports that she has previously used implanon after childbirth, but is not sure what she would like to use after this delivery.   FHR- 146 bpm  Questions were answered. Assessment: LROB [id number] @ [redacted]w[redacted]d  Plan:  Continued routine obstetrical care  F/u in 4 weeks for routine prenatal care    By signing my name below, I, Doreatha Martin, attest that this documentation has been prepared under the direction and in the presence of Tilda Burrow, MD. Electronically Signed: Doreatha Martin, ED Scribe. 07/11/2015. 3:32 PM.  I personally performed the services described in this documentation, which was SCRIBED in my presence. The recorded information has been reviewed and considered accurate. It has been edited as necessary during review. Tilda Burrow, MD

## 2015-07-11 NOTE — Progress Notes (Signed)
Pt worked in today for reassurance. 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 Pt is very concerned.

## 2015-07-17 ENCOUNTER — Ambulatory Visit (INDEPENDENT_AMBULATORY_CARE_PROVIDER_SITE_OTHER): Payer: BLUE CROSS/BLUE SHIELD | Admitting: Advanced Practice Midwife

## 2015-07-17 ENCOUNTER — Encounter: Payer: BLUE CROSS/BLUE SHIELD | Admitting: Advanced Practice Midwife

## 2015-07-17 VITALS — BP 120/80 | HR 72 | Wt 173.0 lb

## 2015-07-17 DIAGNOSIS — Z331 Pregnant state, incidental: Secondary | ICD-10-CM

## 2015-07-17 DIAGNOSIS — Z1389 Encounter for screening for other disorder: Secondary | ICD-10-CM

## 2015-07-17 DIAGNOSIS — Z3492 Encounter for supervision of normal pregnancy, unspecified, second trimester: Secondary | ICD-10-CM

## 2015-07-17 DIAGNOSIS — Z369 Encounter for antenatal screening, unspecified: Secondary | ICD-10-CM

## 2015-07-17 DIAGNOSIS — Z363 Encounter for antenatal screening for malformations: Secondary | ICD-10-CM

## 2015-07-17 LAB — POCT URINALYSIS DIPSTICK
GLUCOSE UA: NEGATIVE
KETONES UA: NEGATIVE
LEUKOCYTES UA: NEGATIVE
Nitrite, UA: NEGATIVE
PROTEIN UA: NEGATIVE
RBC UA: NEGATIVE

## 2015-07-17 NOTE — Progress Notes (Signed)
[id number] [redacted]w[redacted]d Estimated Date of Delivery: 01/01/16  Weight 173 lb (78.472 kg), last menstrual period 03/27/2015.   BP weight and urine results all reviewed and noted.  Please refer to the obstetrical flow sheet for the fundal height and fetal heart rate documentation:  Patient  denies any bleeding and no rupture of membranes symptoms or regular contractions. Patient is without complaints. All questions were answered.  Orders Placed This Encounter  Procedures  . POCT urinalysis dipstick    Plan:  Continued routine obstetrical care,   Return in about 3 weeks (around 08/07/2015) for HY:QMVHQIO, LROB.

## 2015-07-17 NOTE — Patient Instructions (Signed)

## 2015-07-17 NOTE — Addendum Note (Signed)
Addended by: Richardson Chiquito on: 07/17/2015 02:27 PM   Modules accepted: Orders

## 2015-07-18 ENCOUNTER — Encounter: Payer: BLUE CROSS/BLUE SHIELD | Admitting: Advanced Practice Midwife

## 2015-07-19 LAB — MATERNAL SCREEN, INTEGRATED #2
AFP MoM: 1.45
Alpha-Fetoprotein: 44.6 ng/mL
Crown Rump Length: 71.4 mm
DIA MoM: 0.79
DIA Value: 123.9 pg/mL
Estriol, Unconjugated: 0.55 ng/mL
Gest. Age on Collection Date: 13 weeks
Gestational Age: 16.9 weeks
Maternal Age at EDD: 29.2 years
Nuchal Translucency (NT): 2.1 mm
Nuchal Translucency MoM: 1.19
Number of Fetuses: 1
PAPP-A MoM: 0.56
PAPP-A Value: 564.6 ng/mL
Test Results:: NEGATIVE
Weight: 173 [lb_av]
Weight: 173 [lb_av]
hCG MoM: 0.82
hCG Value: 22.2 IU/mL
uE3 MoM: 0.56

## 2015-07-30 ENCOUNTER — Telehealth: Payer: Self-pay | Admitting: Obstetrics & Gynecology

## 2015-07-30 NOTE — Telephone Encounter (Signed)
Pt called stating that she is so congested that the neti pot she was told to use will not work. Please contact pt to see if there is anything else she can take.

## 2015-07-30 NOTE — Telephone Encounter (Signed)
Pt informed of Drenda Freeze recommendation and verbalized understanding.

## 2015-07-30 NOTE — Telephone Encounter (Signed)
She can take about any OTC combo med--such as tylenol cold/sinus (or store brand).  Avoid ASA/ibuprofen.  If she has sx >5 days and getting WORSE, OR >10 days and NOT GETTING BETTER, she will need antibiotics.  Otherwise, tx the symptoms, as it is viral.

## 2015-07-30 NOTE — Telephone Encounter (Signed)
Pt c/o head congestion, productive cough, green drainage, sore throat,  no fever, eyes watering. Tylenol and Neti pot not helping. Possible sinus infection. Is there something OTC she can take?

## 2015-08-08 ENCOUNTER — Ambulatory Visit (INDEPENDENT_AMBULATORY_CARE_PROVIDER_SITE_OTHER): Payer: BLUE CROSS/BLUE SHIELD | Admitting: Advanced Practice Midwife

## 2015-08-08 ENCOUNTER — Ambulatory Visit (INDEPENDENT_AMBULATORY_CARE_PROVIDER_SITE_OTHER): Payer: BLUE CROSS/BLUE SHIELD

## 2015-08-08 ENCOUNTER — Other Ambulatory Visit: Payer: Self-pay | Admitting: Advanced Practice Midwife

## 2015-08-08 VITALS — BP 106/72 | HR 70 | Wt 176.0 lb

## 2015-08-08 DIAGNOSIS — O4442 Low lying placenta NOS or without hemorrhage, second trimester: Secondary | ICD-10-CM

## 2015-08-08 DIAGNOSIS — Z363 Encounter for antenatal screening for malformations: Secondary | ICD-10-CM

## 2015-08-08 DIAGNOSIS — Z3482 Encounter for supervision of other normal pregnancy, second trimester: Secondary | ICD-10-CM

## 2015-08-08 DIAGNOSIS — Z3492 Encounter for supervision of normal pregnancy, unspecified, second trimester: Secondary | ICD-10-CM

## 2015-08-08 DIAGNOSIS — Z331 Pregnant state, incidental: Secondary | ICD-10-CM

## 2015-08-08 DIAGNOSIS — Z3A21 21 weeks gestation of pregnancy: Secondary | ICD-10-CM

## 2015-08-08 DIAGNOSIS — Z3A20 20 weeks gestation of pregnancy: Secondary | ICD-10-CM

## 2015-08-08 DIAGNOSIS — Z36 Encounter for antenatal screening of mother: Secondary | ICD-10-CM

## 2015-08-08 DIAGNOSIS — O4440 Low lying placenta NOS or without hemorrhage, unspecified trimester: Secondary | ICD-10-CM | POA: Insufficient documentation

## 2015-08-08 DIAGNOSIS — Z1389 Encounter for screening for other disorder: Secondary | ICD-10-CM

## 2015-08-08 LAB — POCT URINALYSIS DIPSTICK
GLUCOSE UA: NEGATIVE
KETONES UA: NEGATIVE
Nitrite, UA: NEGATIVE
Protein, UA: NEGATIVE

## 2015-08-08 NOTE — Progress Notes (Signed)
Anatomy US at 20+[redacted] weeks GA.  Single, active female fetus in a cephalic presentation. FHR bpm. Cervix is closed and measures 4.6 cm.  Posterior low lying placenta (0.3cm from internal os). Fluid is normal with SVP 3.7 cm. Bilateral ovaries are within normal limits. EFW 348g which is consistent with dating. All anatomy visualized and appears normal today.

## 2015-08-08 NOTE — Progress Notes (Signed)
[id number] [redacted]w[redacted]d Estimated Date of Delivery: 01/01/16  Blood pressure 106/72, pulse 70, weight 176 lb (79.833 kg), last menstrual period 03/27/2015.   BP weight and urine results all reviewed and noted.  Please refer to the obstetrical flow sheet for the fundal height and fetal heart rate documentation: Anatomy US at 20+[redacted] weeks GA. Single, active female fetus in a cephalic presentation. FHR bpm. Cervix is closed and measures 4.6 cm. Posterior low lying placenta (0.3cm from internal os). Fluid is normal with SVP 3.7 cm. Bilateral ovaries are within normal limits. EFW 348g which is consistent with dating. All anatomy visualized and appears normal today.            Patient reports good fetal movement, denies any bleeding and no rupture of membranes symptoms or regular contractions. Patient is without complaints. All questions were answered.  Orders Placed This Encounter  Procedures  . POCT urinalysis dipstick    Plan:  Continued routine obstetrical care, recheck placental location 28 weeks.   Return in about 4 weeks (around 09/05/2015) for LROB.

## 2015-09-05 ENCOUNTER — Ambulatory Visit (INDEPENDENT_AMBULATORY_CARE_PROVIDER_SITE_OTHER): Payer: BLUE CROSS/BLUE SHIELD | Admitting: Advanced Practice Midwife

## 2015-09-05 VITALS — BP 106/66 | HR 76 | Wt 185.5 lb

## 2015-09-05 DIAGNOSIS — Z1389 Encounter for screening for other disorder: Secondary | ICD-10-CM

## 2015-09-05 DIAGNOSIS — Z3492 Encounter for supervision of normal pregnancy, unspecified, second trimester: Secondary | ICD-10-CM

## 2015-09-05 DIAGNOSIS — Z331 Pregnant state, incidental: Secondary | ICD-10-CM

## 2015-09-05 DIAGNOSIS — O4440 Low lying placenta NOS or without hemorrhage, unspecified trimester: Secondary | ICD-10-CM

## 2015-09-05 LAB — POCT URINALYSIS DIPSTICK
Blood, UA: NEGATIVE
Glucose, UA: NEGATIVE
KETONES UA: NEGATIVE
LEUKOCYTES UA: NEGATIVE
Nitrite, UA: NEGATIVE

## 2015-09-05 NOTE — Patient Instructions (Signed)

## 2015-09-05 NOTE — Progress Notes (Signed)
[id number] 6258w1d Estimated Date of Delivery: 01/01/16  Last menstrual period 03/27/2015.   BP weight and urine results all reviewed and noted.  Please refer to the obstetrical flow sheet for the fundal height and fetal heart rate documentation:  Patient reports good fetal movement, denies any bleeding and no rupture of membranes symptoms or regular contractions. Patient is without complaints. All questions were answered.  No orders of the defined types were placed in this encounter.    Plan:  Continued routine obstetrical care,   Return in about 4 weeks (around 10/03/2015) for PN2/LROB.

## 2015-09-10 ENCOUNTER — Ambulatory Visit (INDEPENDENT_AMBULATORY_CARE_PROVIDER_SITE_OTHER): Payer: BLUE CROSS/BLUE SHIELD | Admitting: Obstetrics and Gynecology

## 2015-09-10 ENCOUNTER — Encounter: Payer: Self-pay | Admitting: Obstetrics and Gynecology

## 2015-09-10 VITALS — BP 120/80 | HR 80 | Wt 189.2 lb

## 2015-09-10 DIAGNOSIS — M533 Sacrococcygeal disorders, not elsewhere classified: Secondary | ICD-10-CM | POA: Diagnosis not present

## 2015-09-10 DIAGNOSIS — Z3A24 24 weeks gestation of pregnancy: Secondary | ICD-10-CM | POA: Diagnosis not present

## 2015-09-10 DIAGNOSIS — O26892 Other specified pregnancy related conditions, second trimester: Secondary | ICD-10-CM

## 2015-09-10 DIAGNOSIS — Z3483 Encounter for supervision of other normal pregnancy, third trimester: Secondary | ICD-10-CM | POA: Diagnosis not present

## 2015-09-10 DIAGNOSIS — Z3493 Encounter for supervision of normal pregnancy, unspecified, third trimester: Secondary | ICD-10-CM

## 2015-09-10 DIAGNOSIS — Z1389 Encounter for screening for other disorder: Secondary | ICD-10-CM

## 2015-09-10 DIAGNOSIS — Z331 Pregnant state, incidental: Secondary | ICD-10-CM

## 2015-09-10 LAB — POCT URINALYSIS DIPSTICK
Blood, UA: NEGATIVE
Glucose, UA: NEGATIVE
KETONES UA: NEGATIVE
Leukocytes, UA: NEGATIVE
Nitrite, UA: NEGATIVE
PROTEIN UA: NEGATIVE

## 2015-09-10 MED ORDER — OXYCODONE-ACETAMINOPHEN 5-325 MG PO TABS: 1.0000 | ORAL_TABLET | ORAL | Status: DC | PRN

## 2015-09-10 MED ORDER — CYCLOBENZAPRINE HCL 10 MG PO TABS
10.0000 mg | ORAL_TABLET | Freq: Three times a day (TID) | ORAL | Status: DC | PRN
Start: 2015-09-10 — End: 2015-10-03

## 2015-09-10 NOTE — Progress Notes (Signed)
[id number] 3424w6d Estimated Date of Delivery: 01/01/16  workin appt FOR  Back pain acute 10/10 Blood pressure 120/80, pulse 80, weight 189 lb 3.2 oz (85.821 kg), last menstrual period 03/27/2015.   refer to the ob flow sheet for FH and FHR, also BP, Wt, Urine results:notable for neg protein  Patient reports  Acute onset of left posterior back/ hip pain while loading Refridge lower drawer, no radiation to leg pain now better, still an 8/10.  good fetal movement, denies any bleeding and no rupture of membranes symptoms or regular contractions. Patient complaints: no preg issues. Pex: point tenderness over post superior iliac crest left hip Questions were answered. Assessment: LROB [id number] @ 7924w6d Sacroiliac joint pain  Plan:  Continued routine obstetrical care, Rx flexeril  percocet  F/u in 2 wk weeks for recheck hip, and pain control.

## 2015-09-10 NOTE — Patient Instructions (Signed)
Sacroiliac Joint Dysfunction Sacroiliac joint dysfunction is a condition that causes inflammation on one or both sides of the sacroiliac (SI) joint. The SI joint connects the lower part of the spine (sacrum) with the two upper portions of the pelvis (ilium). This condition causes deep aching or burning pain in the low back. In some cases, the pain may also spread into one or both buttocks or hips or spread down the legs. CAUSES This condition may be caused by:  Pregnancy. During pregnancy, extra stress is put on the SI joints because the pelvis widens.  Injury, such as:  Car accidents.  Sport-related injuries.  Work-related injuries.  Having one leg that is shorter than the other.  Conditions that affect the joints, such as:  Rheumatoid arthritis.  Gout.  Psoriatic arthritis.  Joint infection (septic arthritis). Sometimes, the cause of SI joint dysfunction is not known. SYMPTOMS Symptoms of this condition include:  Aching or burning pain in the lower back. The pain may also spread to other areas, such as:  Buttocks.  Groin.  Thighs and legs.  Muscle spasms in or around the painful areas.  Increased pain when standing, walking, running, stair climbing, bending, or lifting. DIAGNOSIS Your health care provider will do a physical exam and take your medical history. During the exam, the health care provider may move one or both of your legs to different positions to check for pain. Various tests may be done to help verify the diagnosis, including:  Imaging tests to look for other causes of pain. These may include:  MRI.  CT scan.  Bone scan.  Diagnostic injection. A numbing medicine is injected into the SI joint using a needle. If the pain is temporarily improved or stopped after the injection, this can indicate that SI joint dysfunction is the problem. TREATMENT Treatment may vary depending on the cause and severity of your condition. Treatment options may  include:  Applying ice or heat to the lower back area. This can help to reduce pain and muscle spasms.  Medicines to relieve pain or inflammation or to relax the muscles.  Wearing a back brace (sacroiliac brace) to help support the joint while your back is healing.  Physical therapy to increase muscle strength around the joint and flexibility at the joint. This may also involve learning proper body positions and ways of moving to relieve stress on the joint.  Direct manipulation of the SI joint.  Injections of steroid medicine into the joint in order to reduce pain and swelling.  Radiofrequency ablation to burn away nerves that are carrying pain messages from the joint.  Use of a device that provides electrical stimulation in order to reduce pain at the joint.  Surgery to put in screws and plates that limit or prevent joint motion. This is rare. HOME CARE INSTRUCTIONS  Rest as needed. Limit your activities as directed by your health care provider.  Take medicines only as directed by your health care provider.  If directed, apply ice to the affected area:  Put ice in a plastic bag.  Place a towel between your skin and the bag.  Leave the ice on for 20 minutes, 2-3 times per day.  Use a heating pad or a moist heat pack as directed by your health care provider.  Exercise as directed by your health care provider or physical therapist.  Keep all follow-up visits as directed by your health care provider. This is important. SEEK MEDICAL CARE IF:  Your pain is not controlled   with medicine.  You have a fever.  You have increasingly severe pain. SEEK IMMEDIATE MEDICAL CARE IF:  You have weakness, numbness, or tingling in your legs or feet.  You lose control of your bladder or bowel.   This information is not intended to replace advice given to you by your health care provider. Make sure you discuss any questions you have with your health care provider.   Document Released:  08/28/2008 Document Revised: 10/16/2014 Document Reviewed: 02/06/2014 Elsevier Interactive Patient Education 2016 [employer]  

## 2015-09-24 ENCOUNTER — Ambulatory Visit (INDEPENDENT_AMBULATORY_CARE_PROVIDER_SITE_OTHER): Payer: BLUE CROSS/BLUE SHIELD | Admitting: Women's Health

## 2015-09-24 VITALS — BP 110/72 | HR 70 | Wt 190.0 lb

## 2015-09-24 DIAGNOSIS — Z3483 Encounter for supervision of other normal pregnancy, third trimester: Secondary | ICD-10-CM

## 2015-09-24 DIAGNOSIS — Z3A26 26 weeks gestation of pregnancy: Secondary | ICD-10-CM

## 2015-09-24 DIAGNOSIS — Z331 Pregnant state, incidental: Secondary | ICD-10-CM

## 2015-09-24 DIAGNOSIS — O360120 Maternal care for anti-D [Rh] antibodies, second trimester, not applicable or unspecified: Secondary | ICD-10-CM

## 2015-09-24 DIAGNOSIS — Z3493 Encounter for supervision of normal pregnancy, unspecified, third trimester: Secondary | ICD-10-CM

## 2015-09-24 DIAGNOSIS — O4402 Placenta previa specified as without hemorrhage, second trimester: Secondary | ICD-10-CM

## 2015-09-24 DIAGNOSIS — Z1389 Encounter for screening for other disorder: Secondary | ICD-10-CM

## 2015-09-24 DIAGNOSIS — Z113 Encounter for screening for infections with a predominantly sexual mode of transmission: Secondary | ICD-10-CM

## 2015-09-24 DIAGNOSIS — Z6791 Unspecified blood type, Rh negative: Secondary | ICD-10-CM | POA: Insufficient documentation

## 2015-09-24 DIAGNOSIS — O26899 Other specified pregnancy related conditions, unspecified trimester: Secondary | ICD-10-CM

## 2015-09-24 LAB — POCT URINALYSIS DIPSTICK
GLUCOSE UA: NEGATIVE
Ketones, UA: NEGATIVE
Leukocytes, UA: NEGATIVE
Nitrite, UA: NEGATIVE
RBC UA: NEGATIVE

## 2015-09-24 NOTE — Progress Notes (Signed)
Low-risk OB appointment [id number] 4128w6d Estimated Date of Delivery: 01/01/16 BP 110/72 mmHg  Pulse 70  Wt 190 lb (86.183 kg)  LMP 03/27/2015  BP, weight, and urine reviewed.  Refer to obstetrical flow sheet for FH & FHR.  Reports good fm.  Denies regular uc's, lof, vb, or uti s/s. F/U from SI joint pain 3/28- doing much better. Undecided about contraception, discussed.  Reviewed ptl s/s, fm. Plan:  Continue routine obstetrical care  F/U as scheduled for OB appointment, pn2, and f/u us for low-lying placenta Declines flu shot

## 2015-09-24 NOTE — Patient Instructions (Signed)
You will have your sugar test next visit.  Please do not eat or drink anything after midnight the night before you come, not even water.  You will be here for at least two hours.     Call the office ([phone]) or go to [facility] if:  You begin to have strong, frequent contractions  Your water breaks.  Sometimes it is a big gush of fluid, sometimes it is just a trickle that keeps getting your panties wet or running down your legs  You have vaginal bleeding.  It is normal to have a small amount of spotting if your cervix was checked.   You don't feel your baby moving like normal.  If you don't, get you something to eat and drink and lay down and focus on feeling your baby move.   If your baby is still not moving like normal, you should call the office or go to [facility].  Second Trimester of Pregnancy The second trimester is from week 13 through week 28, months 4 through 6. The second trimester is often a time when you feel your best. Your body has also adjusted to being pregnant, and you begin to feel better physically. Usually, morning sickness has lessened or quit completely, you may have more energy, and you may have an increase in appetite. The second trimester is also a time when the fetus is growing rapidly. At the end of the sixth month, the fetus is about 9 inches long and weighs about 1 pounds. You will likely begin to feel the baby move (quickening) between 18 and 20 weeks of the pregnancy. BODY CHANGES Your body goes through many changes during pregnancy. The changes vary from woman to woman.   Your weight will continue to increase. You will notice your lower abdomen bulging out.  You may begin to get stretch marks on your hips, abdomen, and breasts.  You may develop headaches that can be relieved by medicines approved by your health care provider.  You may urinate more often because the fetus is pressing on your bladder.  You may develop or continue to have  heartburn as a result of your pregnancy.  You may develop constipation because certain hormones are causing the muscles that push waste through your intestines to slow down.  You may develop hemorrhoids or swollen, bulging veins (varicose veins).  You may have back pain because of the weight gain and pregnancy hormones relaxing your joints between the bones in your pelvis and as a result of a shift in weight and the muscles that support your balance.  Your breasts will continue to grow and be tender.  Your gums may bleed and may be sensitive to brushing and flossing.  Dark spots or blotches (chloasma, mask of pregnancy) may develop on your face. This will likely fade after the baby is born.  A dark line from your belly button to the pubic area (linea nigra) may appear. This will likely fade after the baby is born.  You may have changes in your hair. These can include thickening of your hair, rapid growth, and changes in texture. Some women also have hair loss during or after pregnancy, or hair that feels dry or thin. Your hair will most likely return to normal after your baby is born. WHAT TO EXPECT AT YOUR PRENATAL VISITS During a routine prenatal visit:  You will be weighed to make sure you and the fetus are growing normally.  Your blood pressure will be taken.    Your abdomen will be measured to track your baby's growth.  The fetal heartbeat will be listened to.  Any test results from the previous visit will be discussed. Your health care provider may ask you:  How you are feeling.  If you are feeling the baby move.  If you have had any abnormal symptoms, such as leaking fluid, bleeding, severe headaches, or abdominal cramping.  If you have any questions. Other tests that may be performed during your second trimester include:  Blood tests that check for:  Low iron levels (anemia).  Gestational diabetes (between 24 and 28 weeks).  Rh antibodies.  Urine tests to check  for infections, diabetes, or protein in the urine.  An ultrasound to confirm the proper growth and development of the baby.  An amniocentesis to check for possible genetic problems.  Fetal screens for spina bifida and Down syndrome. HOME CARE INSTRUCTIONS   Avoid all smoking, herbs, alcohol, and unprescribed drugs. These chemicals affect the formation and growth of the baby.  Follow your health care provider's instructions regarding medicine use. There are medicines that are either safe or unsafe to take during pregnancy.  Exercise only as directed by your health care provider. Experiencing uterine cramps is a good sign to stop exercising.  Continue to eat regular, healthy meals.  Wear a good support bra for breast tenderness.  Do not use hot tubs, steam rooms, or saunas.  Wear your seat belt at all times when driving.  Avoid raw meat, uncooked cheese, cat litter boxes, and soil used by cats. These carry germs that can cause birth defects in the baby.  Take your prenatal vitamins.  Try taking a stool softener (if your health care provider approves) if you develop constipation. Eat more high-fiber foods, such as fresh vegetables or fruit and whole grains. Drink plenty of fluids to keep your urine clear or pale yellow.  Take warm sitz baths to soothe any pain or discomfort caused by hemorrhoids. Use hemorrhoid cream if your health care provider approves.  If you develop varicose veins, wear support hose. Elevate your feet for 15 minutes, 3-4 times a day. Limit salt in your diet.  Avoid heavy lifting, wear low heel shoes, and practice good posture.  Rest with your legs elevated if you have leg cramps or low back pain.  Visit your dentist if you have not gone yet during your pregnancy. Use a soft toothbrush to brush your teeth and be gentle when you floss.  A sexual relationship may be continued unless your health care provider directs you otherwise.  Continue to go to all your  prenatal visits as directed by your health care provider. SEEK MEDICAL CARE IF:   You have dizziness.  You have mild pelvic cramps, pelvic pressure, or nagging pain in the abdominal area.  You have persistent nausea, vomiting, or diarrhea.  You have a bad smelling vaginal discharge.  You have pain with urination. SEEK IMMEDIATE MEDICAL CARE IF:   You have a fever.  You are leaking fluid from your vagina.  You have spotting or bleeding from your vagina.  You have severe abdominal cramping or pain.  You have rapid weight gain or loss.  You have shortness of breath with chest pain.  You notice sudden or extreme swelling of your face, hands, ankles, feet, or legs.  You have not felt your baby move in over an hour.  You have severe headaches that do not go away with medicine.  You have vision changes.   Document Released: 05/26/2001 Document Revised: 06/06/2013 Document Reviewed: 08/02/2012 [employer] Patient Information 2015 [employer]. This information is not intended to replace advice given to you by your health care provider. Make sure you discuss any questions you have with your health care provider.     

## 2015-09-26 LAB — GC/CHLAMYDIA PROBE AMP
Chlamydia trachomatis, NAA: NEGATIVE
Neisseria gonorrhoeae by PCR: NEGATIVE

## 2015-10-03 ENCOUNTER — Ambulatory Visit (INDEPENDENT_AMBULATORY_CARE_PROVIDER_SITE_OTHER): Payer: Medicaid Other

## 2015-10-03 ENCOUNTER — Ambulatory Visit (INDEPENDENT_AMBULATORY_CARE_PROVIDER_SITE_OTHER): Payer: BLUE CROSS/BLUE SHIELD | Admitting: Advanced Practice Midwife

## 2015-10-03 ENCOUNTER — Other Ambulatory Visit: Payer: BLUE CROSS/BLUE SHIELD

## 2015-10-03 VITALS — BP 120/62 | HR 84 | Wt 195.0 lb

## 2015-10-03 DIAGNOSIS — Z1389 Encounter for screening for other disorder: Secondary | ICD-10-CM

## 2015-10-03 DIAGNOSIS — Z3483 Encounter for supervision of other normal pregnancy, third trimester: Secondary | ICD-10-CM | POA: Diagnosis not present

## 2015-10-03 DIAGNOSIS — O4402 Placenta previa specified as without hemorrhage, second trimester: Secondary | ICD-10-CM | POA: Diagnosis not present

## 2015-10-03 DIAGNOSIS — Z369 Encounter for antenatal screening, unspecified: Secondary | ICD-10-CM

## 2015-10-03 DIAGNOSIS — Z3492 Encounter for supervision of normal pregnancy, unspecified, second trimester: Secondary | ICD-10-CM

## 2015-10-03 DIAGNOSIS — Z3A29 29 weeks gestation of pregnancy: Secondary | ICD-10-CM | POA: Diagnosis not present

## 2015-10-03 DIAGNOSIS — Z331 Pregnant state, incidental: Secondary | ICD-10-CM

## 2015-10-03 DIAGNOSIS — Z131 Encounter for screening for diabetes mellitus: Secondary | ICD-10-CM

## 2015-10-03 DIAGNOSIS — O4440 Low lying placenta NOS or without hemorrhage, unspecified trimester: Secondary | ICD-10-CM

## 2015-10-03 DIAGNOSIS — O360120 Maternal care for anti-D [Rh] antibodies, second trimester, not applicable or unspecified: Secondary | ICD-10-CM | POA: Diagnosis not present

## 2015-10-03 LAB — POCT URINALYSIS DIPSTICK
Blood, UA: NEGATIVE
Glucose, UA: NEGATIVE
KETONES UA: NEGATIVE
LEUKOCYTES UA: NEGATIVE
Nitrite, UA: NEGATIVE
PROTEIN UA: NEGATIVE

## 2015-10-03 NOTE — Progress Notes (Signed)
[id number] 3463w2d Estimated Date of Delivery: 12/24/15  Blood pressure 120/62, pulse 84, weight 195 lb (88.451 kg), last menstrual period 03/27/2015.   BP weight and urine results all reviewed and noted.  Please refer to the obstetrical flow sheet for the fundal height and fetal heart rate documentation:US TV 28+2 wks,cephalic,post pl gr 0, tip of pl to cx 3.8cm,cx 3.5cm,normal ov's bilat,fhr 135 bpm,afi 16.2cm  Patient reports good fetal movement, denies any bleeding and no rupture of membranes symptoms or regular contractions. Patient is without complaints. All questions were answered.  Orders Placed This Encounter  Procedures  . POCT urinalysis dipstick    Plan:  Continued routine obstetrical care, PN2 today  Return in about 3 weeks (around 10/24/2015) for LROB.

## 2015-10-03 NOTE — Progress Notes (Signed)
US TV 28+2 wks,cephalic,post pl gr 0, tip of pl to cx 3.8cm,cx 3.5cm,normal ov's bilat,fhr 135 bpm,afi 16.2cm

## 2015-10-03 NOTE — Patient Instructions (Signed)

## 2015-10-04 LAB — CBC
Hematocrit: 28.4 % — ABNORMAL LOW (ref 34.0–46.6)
Hemoglobin: 9.6 g/dL — ABNORMAL LOW (ref 11.1–15.9)
MCH: 27.7 pg (ref 26.6–33.0)
MCHC: 33.8 g/dL (ref 31.5–35.7)
MCV: 82 fL (ref 79–97)
PLATELETS: 174 10*3/uL (ref 150–379)
RBC: 3.47 x10E6/uL — AB (ref 3.77–5.28)
RDW: 13.6 % (ref 12.3–15.4)
WBC: 9.1 10*3/uL (ref 3.4–10.8)

## 2015-10-04 LAB — GLUCOSE TOLERANCE, 2 HOURS W/ 1HR
GLUCOSE, FASTING: 78 mg/dL (ref 65–91)
Glucose, 1 hour: 134 mg/dL (ref 65–179)
Glucose, 2 hour: 69 mg/dL (ref 65–152)

## 2015-10-04 LAB — HIV ANTIBODY (ROUTINE TESTING W REFLEX): HIV Screen 4th Generation wRfx: NONREACTIVE

## 2015-10-04 LAB — ANTIBODY SCREEN: ANTIBODY SCREEN: NEGATIVE

## 2015-10-04 LAB — RPR: RPR Ser Ql: NONREACTIVE

## 2015-10-07 ENCOUNTER — Other Ambulatory Visit: Payer: Self-pay | Admitting: Women's Health

## 2015-10-07 ENCOUNTER — Telehealth: Payer: Self-pay | Admitting: *Deleted

## 2015-10-07 MED ORDER — FERROUS SULFATE 325 (65 FE) MG PO TABS: 325.0000 mg | ORAL_TABLET | Freq: Two times a day (BID) | ORAL | Status: DC

## 2015-10-07 NOTE — Telephone Encounter (Signed)
Pt informed per Joellyn HaffKim Booker, CNM, anemic, RX for Fe supplement e-scribed, continue PNV daily, and increase iron rich foods (red meat, green leafy veg, beans). Pt requesting Glucose tolerance results which are WNL. Pt verbalized understanding.

## 2015-10-24 ENCOUNTER — Encounter: Payer: Self-pay | Admitting: Advanced Practice Midwife

## 2015-10-24 ENCOUNTER — Ambulatory Visit (INDEPENDENT_AMBULATORY_CARE_PROVIDER_SITE_OTHER): Payer: Medicaid Other | Admitting: Advanced Practice Midwife

## 2015-10-24 VITALS — BP 100/66 | HR 76 | Wt 199.0 lb

## 2015-10-24 DIAGNOSIS — O4403 Placenta previa specified as without hemorrhage, third trimester: Secondary | ICD-10-CM

## 2015-10-24 DIAGNOSIS — Z331 Pregnant state, incidental: Secondary | ICD-10-CM

## 2015-10-24 DIAGNOSIS — Z3403 Encounter for supervision of normal first pregnancy, third trimester: Secondary | ICD-10-CM

## 2015-10-24 DIAGNOSIS — Z3A32 32 weeks gestation of pregnancy: Secondary | ICD-10-CM

## 2015-10-24 DIAGNOSIS — O360131 Maternal care for anti-D [Rh] antibodies, third trimester, fetus 1: Secondary | ICD-10-CM | POA: Diagnosis not present

## 2015-10-24 DIAGNOSIS — Z6791 Unspecified blood type, Rh negative: Secondary | ICD-10-CM

## 2015-10-24 DIAGNOSIS — Z1389 Encounter for screening for other disorder: Secondary | ICD-10-CM

## 2015-10-24 LAB — POCT URINALYSIS DIPSTICK
Blood, UA: NEGATIVE
GLUCOSE UA: NEGATIVE
Ketones, UA: NEGATIVE
NITRITE UA: NEGATIVE

## 2015-10-24 MED ORDER — RHO D IMMUNE GLOBULIN 1500 UNIT/2ML IJ SOSY
300.0000 ug | PREFILLED_SYRINGE | Freq: Once | INTRAMUSCULAR | Status: AC
  Administered 2015-10-24: 300 ug via INTRAMUSCULAR

## 2015-10-24 NOTE — Patient Instructions (Signed)
Braxton Hicks Contractions °Contractions of the uterus can occur throughout pregnancy. Contractions are not always a sign that you are in labor.  °WHAT ARE BRAXTON HICKS CONTRACTIONS?  °Contractions that occur before labor are called Braxton Hicks contractions, or false labor. Toward the end of pregnancy (32-34 weeks), these contractions can develop more often and may become more forceful. This is not true labor because these contractions do not result in opening (dilatation) and thinning of the cervix. They are sometimes difficult to tell apart from true labor because these contractions can be forceful and people have different pain tolerances. You should not feel embarrassed if you go to the hospital with false labor. Sometimes, the only way to tell if you are in true labor is for your health care provider to look for changes in the cervix. °If there are no prenatal problems or other health problems associated with the pregnancy, it is completely safe to be sent home with false labor and await the onset of true labor. °HOW CAN YOU TELL THE DIFFERENCE BETWEEN TRUE AND FALSE LABOR? °False Labor °· The contractions of false labor are usually shorter and not as hard as those of true labor.   °· The contractions are usually irregular.   °· The contractions are often felt in the front of the lower abdomen and in the groin.   °· The contractions may go away when you walk around or change positions while lying down.   °· The contractions get weaker and are shorter lasting as time goes on.   °· The contractions do not usually become progressively stronger, regular, and closer together as with true labor.   °True Labor °· Contractions in true labor last 30-70 seconds, become very regular, usually become more intense, and increase in frequency.   °· The contractions do not go away with walking.   °· The discomfort is usually felt in the top of the uterus and spreads to the lower abdomen and low back.   °· True labor can be  determined by your health care provider with an exam. This will show that the cervix is dilating and getting thinner.   °WHAT TO REMEMBER °· Keep up with your usual exercises and follow other instructions given by your health care provider.   °· Take medicines as directed by your health care provider.   °· Keep your regular prenatal appointments.   °· Eat and drink lightly if you think you are going into labor.   °· If Braxton Hicks contractions are making you uncomfortable:   °¨ Change your position from lying down or resting to walking, or from walking to resting.   °¨ Sit and rest in a tub of warm water.   °¨ Drink 2-3 glasses of water. Dehydration may cause these contractions.   °¨ Do slow and deep breathing several times an hour.   °WHEN SHOULD I SEEK IMMEDIATE MEDICAL CARE? °Seek immediate medical care if: °· Your contractions become stronger, more regular, and closer together.   °· You have fluid leaking or gushing from your vagina.   °· You have a fever.   °· You pass blood-tinged mucus.   °· You have vaginal bleeding.   °· You have continuous abdominal pain.   °· You have low back pain that you never had before.   °· You feel your baby's head pushing down and causing pelvic pressure.   °· Your baby is not moving as much as it used to.   °  °This information is not intended to replace advice given to you by your health care provider. Make sure you discuss any questions you have with your health care   provider. °  °Document Released: 06/01/2005 Document Revised: 06/06/2013 Document Reviewed: 03/13/2013 °Elsevier Interactive Patient Education ©2016 [employer] ° °

## 2015-10-24 NOTE — Progress Notes (Signed)
[id number] 6840w2d Estimated Date of Delivery: 12/24/15  Blood pressure 100/66, pulse 76, weight 199 lb (90.266 kg), last menstrual period 03/27/2015.   BP weight and urine results all reviewed and noted.  Please refer to the obstetrical flow sheet for the fundal height and fetal heart rate documentation:  Patient reports good fetal movement, denies any bleeding and no rupture of membranes symptoms or regular contractions. Patient is without complaints. Other than normal pregnancy complaints  All questions were answered.  Orders Placed This Encounter  Procedures  . POCT urinalysis dipstick    Plan:  Continued routine obstetrical care,   Return in about 2 weeks (around 11/07/2015) for LROB.

## 2015-11-07 ENCOUNTER — Encounter: Payer: Self-pay | Admitting: Advanced Practice Midwife

## 2015-11-07 ENCOUNTER — Ambulatory Visit (INDEPENDENT_AMBULATORY_CARE_PROVIDER_SITE_OTHER): Payer: Medicaid Other | Admitting: Advanced Practice Midwife

## 2015-11-07 VITALS — BP 108/68 | HR 80 | Wt 202.0 lb

## 2015-11-07 DIAGNOSIS — Z3493 Encounter for supervision of normal pregnancy, unspecified, third trimester: Secondary | ICD-10-CM

## 2015-11-07 DIAGNOSIS — Z3A33 33 weeks gestation of pregnancy: Secondary | ICD-10-CM

## 2015-11-07 DIAGNOSIS — Z3483 Encounter for supervision of other normal pregnancy, third trimester: Secondary | ICD-10-CM

## 2015-11-07 DIAGNOSIS — O26843 Uterine size-date discrepancy, third trimester: Secondary | ICD-10-CM

## 2015-11-07 DIAGNOSIS — Z1389 Encounter for screening for other disorder: Secondary | ICD-10-CM

## 2015-11-07 DIAGNOSIS — Z331 Pregnant state, incidental: Secondary | ICD-10-CM

## 2015-11-07 LAB — POCT URINALYSIS DIPSTICK
Blood, UA: NEGATIVE
GLUCOSE UA: NEGATIVE
KETONES UA: NEGATIVE
LEUKOCYTES UA: NEGATIVE
Nitrite, UA: NEGATIVE
Protein, UA: NEGATIVE

## 2015-11-07 MED ORDER — VALACYCLOVIR HCL 500 MG PO TABS: 500.0000 mg | ORAL_TABLET | Freq: Two times a day (BID) | ORAL | Status: DC

## 2015-11-07 NOTE — Progress Notes (Signed)
[id number] 4159w2d Estimated Date of Delivery: 12/24/15  Blood pressure 108/68, pulse 80, weight 202 lb (91.627 kg), last menstrual period 03/27/2015.   BP weight and urine results all reviewed and noted.  Please refer to the obstetrical flow sheet for the fundal height and fetal heart rate documentation: baby feels bigger.   Patient reports good fetal movement, denies any bleeding and no rupture of membranes symptoms or regular contractions. Patient is without complaints. All questions were answered.  Orders Placed This Encounter  Procedures  . US OB Follow Up  . 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

## 2015-11-07 NOTE — Patient Instructions (Addendum)
Red raspberry leaf tea

## 2015-11-21 ENCOUNTER — Ambulatory Visit (INDEPENDENT_AMBULATORY_CARE_PROVIDER_SITE_OTHER): Payer: Medicaid Other

## 2015-11-21 ENCOUNTER — Encounter: Payer: Self-pay | Admitting: Obstetrics and Gynecology

## 2015-11-21 ENCOUNTER — Other Ambulatory Visit: Payer: Self-pay

## 2015-11-21 ENCOUNTER — Ambulatory Visit (INDEPENDENT_AMBULATORY_CARE_PROVIDER_SITE_OTHER): Payer: Medicaid Other | Admitting: Obstetrics and Gynecology

## 2015-11-21 ENCOUNTER — Encounter: Payer: Self-pay | Admitting: Obstetrics & Gynecology

## 2015-11-21 VITALS — BP 100/60 | HR 92 | Wt 203.0 lb

## 2015-11-21 DIAGNOSIS — Z1389 Encounter for screening for other disorder: Secondary | ICD-10-CM

## 2015-11-21 DIAGNOSIS — Z3A36 36 weeks gestation of pregnancy: Secondary | ICD-10-CM

## 2015-11-21 DIAGNOSIS — Z331 Pregnant state, incidental: Secondary | ICD-10-CM

## 2015-11-21 DIAGNOSIS — O26843 Uterine size-date discrepancy, third trimester: Secondary | ICD-10-CM | POA: Diagnosis not present

## 2015-11-21 DIAGNOSIS — Z3493 Encounter for supervision of normal pregnancy, unspecified, third trimester: Secondary | ICD-10-CM

## 2015-11-21 DIAGNOSIS — Z3483 Encounter for supervision of other normal pregnancy, third trimester: Secondary | ICD-10-CM

## 2015-11-21 DIAGNOSIS — O4440 Low lying placenta NOS or without hemorrhage, unspecified trimester: Secondary | ICD-10-CM

## 2015-11-21 LAB — POCT URINALYSIS DIPSTICK
Blood, UA: NEGATIVE
GLUCOSE UA: NEGATIVE
KETONES UA: NEGATIVE
LEUKOCYTES UA: NEGATIVE
NITRITE UA: NEGATIVE

## 2015-11-21 NOTE — Progress Notes (Signed)
US 35+2 wks,cephalic,fhr 132 bpm,post pl gr 1,normal ov's bilat,fhr 15 cm,efw 2689 g 50%

## 2015-11-21 NOTE — Progress Notes (Signed)
Pt states that she was woken from her sleep the other night with a sharp pain in the top of her stomach, the pain dulled after about 30 minutes.

## 2015-11-21 NOTE — Progress Notes (Signed)
[id number] 7659w2d Estimated Date of Delivery: 12/24/15  Blood pressure 100/60, pulse 92, weight 203 lb (92.08 kg), last menstrual period 03/27/2015.  Growth scan ordered = 50%ile. Normal u/s refer to the ob flow sheet for FH and FHR, also BP, Wt, Urine results:notable for neg proteing  Patient reports   good fetal movement, denies any bleeding and no rupture of membranes symptoms or regular contractions. Patient complaints:medicaid pending, seeking reinstatement of [employer] due to billing issues that led to cancellation [facility]  Questions were answered. Assessment: LROB [id number] @ 5159w2d  AGA  Plan:  Continued routine obstetrical care, breast feeding / nexplanon  F/u in 2 weeks for lrob

## 2015-11-28 LAB — OB RESULTS CONSOLE GBS: GBS: NEGATIVE

## 2015-12-04 ENCOUNTER — Ambulatory Visit (INDEPENDENT_AMBULATORY_CARE_PROVIDER_SITE_OTHER): Payer: Medicaid Other | Admitting: Advanced Practice Midwife

## 2015-12-04 VITALS — BP 114/70 | HR 76 | Wt 206.0 lb

## 2015-12-04 DIAGNOSIS — Z1389 Encounter for screening for other disorder: Secondary | ICD-10-CM

## 2015-12-04 DIAGNOSIS — Z331 Pregnant state, incidental: Secondary | ICD-10-CM

## 2015-12-04 DIAGNOSIS — Z369 Encounter for antenatal screening, unspecified: Secondary | ICD-10-CM

## 2015-12-04 DIAGNOSIS — O360120 Maternal care for anti-D [Rh] antibodies, second trimester, not applicable or unspecified: Secondary | ICD-10-CM

## 2015-12-04 DIAGNOSIS — Z3493 Encounter for supervision of normal pregnancy, unspecified, third trimester: Secondary | ICD-10-CM

## 2015-12-04 DIAGNOSIS — Z3685 Encounter for antenatal screening for Streptococcus B: Secondary | ICD-10-CM

## 2015-12-04 LAB — POCT URINALYSIS DIPSTICK
Glucose, UA: NEGATIVE
KETONES UA: NEGATIVE
Nitrite, UA: NEGATIVE
RBC UA: NEGATIVE

## 2015-12-04 NOTE — Progress Notes (Signed)
[id number] 182w1d Estimated Date of Delivery: 12/24/15  Blood pressure 114/70, pulse 76, weight 93.441 kg (206 lb), last menstrual period 03/27/2015.   BP weight and urine results all reviewed and noted.  Please refer to the obstetrical flow sheet for the fundal height and fetal heart rate documentation:  Patient reports good fetal movement, denies any bleeding and no rupture of membranes symptoms or regular contractions. Patient is without complaints. All questions were answered.  Orders Placed This Encounter  Procedures  . GC/Chlamydia Probe Amp  . Strep Gp B NAA  . POCT urinalysis dipstick    Plan:  Continued routine obstetrical care,   Return in about 8 days (around 12/12/2015) for LROB.

## 2015-12-04 NOTE — Patient Instructions (Addendum)

## 2015-12-05 ENCOUNTER — Encounter: Payer: Self-pay | Admitting: Advanced Practice Midwife

## 2015-12-05 LAB — OB RESULTS CONSOLE GBS: GBS: NEGATIVE

## 2015-12-06 LAB — STREP GP B NAA: Strep Gp B NAA: NEGATIVE

## 2015-12-07 LAB — GC/CHLAMYDIA PROBE AMP
Chlamydia trachomatis, NAA: NEGATIVE
Neisseria gonorrhoeae by PCR: NEGATIVE

## 2015-12-10 ENCOUNTER — Inpatient Hospital Stay (HOSPITAL_COMMUNITY)
Admission: AD | Admit: 2015-12-10 | Discharge: 2015-12-10 | Disposition: A | Discharge status: Home / Self Care | Payer: Medicaid Other | Source: Ambulatory Visit | Attending: Obstetrics and Gynecology | Admitting: Obstetrics and Gynecology

## 2015-12-10 ENCOUNTER — Encounter (HOSPITAL_COMMUNITY): Payer: Self-pay

## 2015-12-10 DIAGNOSIS — Z3493 Encounter for supervision of normal pregnancy, unspecified, third trimester: Secondary | ICD-10-CM | POA: Diagnosis present

## 2015-12-10 LAB — POCT FERN TEST: POCT Fern Test: NEGATIVE

## 2015-12-10 NOTE — MAU Note (Signed)
Pt c/o ? LOF that started around 3pm-clear and watery, has continued to leak. States that it is not a lot, but is wearing a panty liner. Is having contractions every 10-15 mins on and off. Denies vag bleeding. +FM. Cervix was 3cm on last Wednesday.

## 2015-12-10 NOTE — Discharge Instructions (Signed)
Third Trimester of Pregnancy °The third trimester is from week 29 through week 42, months 7 through 9. The third trimester is a time when the fetus is growing rapidly. At the end of the ninth month, the fetus is about 20 inches in length and weighs 6-10 pounds.  °BODY CHANGES °Your body goes through many changes during pregnancy. The changes vary from woman to woman.  °· Your weight will continue to increase. You can expect to gain 25-35 pounds (11-16 kg) by the end of the pregnancy. °· You may begin to get stretch marks on your hips, abdomen, and breasts. °· You may urinate more often because the fetus is moving lower into your pelvis and pressing on your bladder. °· You may develop or continue to have heartburn as a result of your pregnancy. °· You may develop constipation because certain hormones are causing the muscles that push waste through your intestines to slow down. °· You may develop hemorrhoids or swollen, bulging veins (varicose veins). °· You may have pelvic pain because of the weight gain and pregnancy hormones relaxing your joints between the bones in your pelvis. Backaches may result from overexertion of the muscles supporting your posture. °· You may have changes in your hair. These can include thickening of your hair, rapid growth, and changes in texture. Some women also have hair loss during or after pregnancy, or hair that feels dry or thin. Your hair will most likely return to normal after your baby is born. °· Your breasts will continue to grow and be tender. A yellow discharge may leak from your breasts called colostrum. °· Your belly button may stick out. °· You may feel short of breath because of your expanding uterus. °· You may notice the fetus "dropping," or moving lower in your abdomen. °· You may have a bloody mucus discharge. This usually occurs a few days to a week before labor begins. °· Your cervix becomes thin and soft (effaced) near your due date. °WHAT TO EXPECT AT YOUR PRENATAL  EXAMS  °You will have prenatal exams every 2 weeks until week 36. Then, you will have weekly prenatal exams. During a routine prenatal visit: °· You will be weighed to make sure you and the fetus are growing normally. °· Your blood pressure is taken. °· Your abdomen will be measured to track your baby's growth. °· The fetal heartbeat will be listened to. °· Any test results from the previous visit will be discussed. °· You may have a cervical check near your due date to see if you have effaced. °At around 36 weeks, your caregiver will check your cervix. At the same time, your caregiver will also perform a test on the secretions of the vaginal tissue. This test is to determine if a type of bacteria, Group B streptococcus, is present. Your caregiver will explain this further. °Your caregiver may ask you: °· What your birth plan is. °· How you are feeling. °· If you are feeling the baby move. °· If you have had any abnormal symptoms, such as leaking fluid, bleeding, severe headaches, or abdominal cramping. °· If you are using any tobacco products, including cigarettes, chewing tobacco, and electronic cigarettes. °· If you have any questions. °Other tests or screenings that may be performed during your third trimester include: °· Blood tests that check for low iron levels (anemia). °· Fetal testing to check the health, activity level, and growth of the fetus. Testing is done if you have certain medical conditions or if   there are problems during the pregnancy. °· HIV (human immunodeficiency virus) testing. If you are at high risk, you may be screened for HIV during your third trimester of pregnancy. °FALSE LABOR °You may feel small, irregular contractions that eventually go away. These are called Braxton Hicks contractions, or false labor. Contractions may last for hours, days, or even weeks before true labor sets in. If contractions come at regular intervals, intensify, or become painful, it is best to be seen by your  caregiver.  °SIGNS OF LABOR  °· Menstrual-like cramps. °· Contractions that are 5 minutes apart or less. °· Contractions that start on the top of the uterus and spread down to the lower abdomen and back. °· A sense of increased pelvic pressure or back pain. °· A watery or bloody mucus discharge that comes from the vagina. °If you have any of these signs before the 37th week of pregnancy, call your caregiver right away. You need to go to the hospital to get checked immediately. °HOME CARE INSTRUCTIONS  °· Avoid all smoking, herbs, alcohol, and unprescribed drugs. These chemicals affect the formation and growth of the baby. °· Do not use any tobacco products, including cigarettes, chewing tobacco, and electronic cigarettes. If you need help quitting, ask your health care provider. You may receive counseling support and other resources to help you quit. °· Follow your caregiver's instructions regarding medicine use. There are medicines that are either safe or unsafe to take during pregnancy. °· Exercise only as directed by your caregiver. Experiencing uterine cramps is a good sign to stop exercising. °· Continue to eat regular, healthy meals. °· Wear a good support bra for breast tenderness. °· Do not use hot tubs, steam rooms, or saunas. °· Wear your seat belt at all times when driving. °· Avoid raw meat, uncooked cheese, cat litter boxes, and soil used by cats. These carry germs that can cause birth defects in the baby. °· Take your prenatal vitamins. °· Take 1500-2000 mg of calcium daily starting at the 20th week of pregnancy until you deliver your baby. °· Try taking a stool softener (if your caregiver approves) if you develop constipation. Eat more high-fiber foods, such as fresh vegetables or fruit and whole grains. Drink plenty of fluids to keep your urine clear or pale yellow. °· Take warm sitz baths to soothe any pain or discomfort caused by hemorrhoids. Use hemorrhoid cream if your caregiver approves. °· If  you develop varicose veins, wear support hose. Elevate your feet for 15 minutes, 3-4 times a day. Limit salt in your diet. °· Avoid heavy lifting, wear low heal shoes, and practice good posture. °· Rest a lot with your legs elevated if you have leg cramps or low back pain. °· Visit your dentist if you have not gone during your pregnancy. Use a soft toothbrush to brush your teeth and be gentle when you floss. °· A sexual relationship may be continued unless your caregiver directs you otherwise. °· Do not travel far distances unless it is absolutely necessary and only with the approval of your caregiver. °· Take prenatal classes to understand, practice, and ask questions about the labor and delivery. °· Make a trial run to the hospital. °· Pack your hospital bag. °· Prepare the baby's nursery. °· Continue to go to all your prenatal visits as directed by your caregiver. °SEEK MEDICAL CARE IF: °· You are unsure if you are in labor or if your water has broken. °· You have dizziness. °· You have   mild pelvic cramps, pelvic pressure, or nagging pain in your abdominal area. °· You have persistent nausea, vomiting, or diarrhea. °· You have a bad smelling vaginal discharge. °· You have pain with urination. °SEEK IMMEDIATE MEDICAL CARE IF:  °· You have a fever. °· You are leaking fluid from your vagina. °· You have spotting or bleeding from your vagina. °· You have severe abdominal cramping or pain. °· You have rapid weight loss or gain. °· You have shortness of breath with chest pain. °· You notice sudden or extreme swelling of your face, hands, ankles, feet, or legs. °· You have not felt your baby move in over an hour. °· You have severe headaches that do not go away with medicine. °· You have vision changes. °  °This information is not intended to replace advice given to you by your health care provider. Make sure you discuss any questions you have with your health care provider. °  °Document Released: 05/26/2001 Document  Revised: 06/22/2014 Document Reviewed: 08/02/2012 °[employer]vier Interactive Patient Education ©2016 [employer] °Fetal Movement Counts °Patient Name: __________________________________________________ Patient Due Date: ____________________ °Performing a fetal movement count is highly recommended in high-risk pregnancies, but it is good for every pregnant woman to do. Your health care provider may ask you to start counting fetal movements at 28 weeks of the pregnancy. Fetal movements often increase: °· After eating a full meal. °· After physical activity. °· After eating or drinking something sweet or cold. °· At rest. °Pay attention to when you feel the baby is most active. This will help you notice a pattern of your baby's sleep and wake cycles and what factors contribute to an increase in fetal movement. It is important to perform a fetal movement count at the same time each day when your baby is normally most active.  °HOW TO COUNT FETAL MOVEMENTS °· Find a quiet and comfortable area to sit or lie down on your left side. Lying on your left side provides the best blood and oxygen circulation to your baby. °· Write down the day and time on a sheet of paper or in a journal. °· Start counting kicks, flutters, swishes, rolls, or jabs in a 2-hour period. You should feel at least 10 movements within 2 hours. °· If you do not feel 10 movements in 2 hours, wait 2-3 hours and count again. Look for a change in the pattern or not enough counts in 2 hours. °SEEK MEDICAL CARE IF: °· You feel less than 10 counts in 2 hours, tried twice. °· There is no movement in over an hour. °· The pattern is changing or taking longer each day to reach 10 counts in 2 hours. °· You feel the baby is not moving as he or she usually does. °Date: ____________ Movements: ____________ Start time: ____________ Finish time: ____________  °Date: ____________ Movements: ____________ Start time: ____________ Finish time: ____________ °Date: ____________  Movements: ____________ Start time: ____________ Finish time: ____________ °Date: ____________ Movements: ____________ Start time: ____________ Finish time: ____________ °Date: ____________ Movements: ____________ Start time: ____________ Finish time: ____________ °Date: ____________ Movements: ____________ Start time: ____________ Finish time: ____________ °Date: ____________ Movements: ____________ Start time: ____________ Finish time: ____________ °Date: ____________ Movements: ____________ Start time: ____________ Finish time: ____________  °Date: ____________ Movements: ____________ Start time: ____________ Finish time: ____________ °Date: ____________ Movements: ____________ Start time: ____________ Finish time: ____________ °Date: ____________ Movements: ____________ Start time: ____________ Finish time: ____________ °Date: ____________ Movements: ____________ Start time: ____________ Finish time: ____________ °Date:   ____________ Movements: ____________ Start time: ____________ Finish time: ____________ °Date: ____________ Movements: ____________ Start time: ____________ Finish time: ____________ °Date: ____________ Movements: ____________ Start time: ____________ Finish time: ____________  °Date: ____________ Movements: ____________ Start time: ____________ Finish time: ____________ °Date: ____________ Movements: ____________ Start time: ____________ Finish time: ____________ °Date: ____________ Movements: ____________ Start time: ____________ Finish time: ____________ °Date: ____________ Movements: ____________ Start time: ____________ Finish time: ____________ °Date: ____________ Movements: ____________ Start time: ____________ Finish time: ____________ °Date: ____________ Movements: ____________ Start time: ____________ Finish time: ____________ °Date: ____________ Movements: ____________ Start time: ____________ Finish time: ____________  °Date: ____________ Movements: ____________ Start time:  ____________ Finish time: ____________ °Date: ____________ Movements: ____________ Start time: ____________ Finish time: ____________ °Date: ____________ Movements: ____________ Start time: ____________ Finish time: ____________ °Date: ____________ Movements: ____________ Start time: ____________ Finish time: ____________ °Date: ____________ Movements: ____________ Start time: ____________ Finish time: ____________ °Date: ____________ Movements: ____________ Start time: ____________ Finish time: ____________ °Date: ____________ Movements: ____________ Start time: ____________ Finish time: ____________  °Date: ____________ Movements: ____________ Start time: ____________ Finish time: ____________ °Date: ____________ Movements: ____________ Start time: ____________ Finish time: ____________ °Date: ____________ Movements: ____________ Start time: ____________ Finish time: ____________ °Date: ____________ Movements: ____________ Start time: ____________ Finish time: ____________ °Date: ____________ Movements: ____________ Start time: ____________ Finish time: ____________ °Date: ____________ Movements: ____________ Start time: ____________ Finish time: ____________ °Date: ____________ Movements: ____________ Start time: ____________ Finish time: ____________  °Date: ____________ Movements: ____________ Start time: ____________ Finish time: ____________ °Date: ____________ Movements: ____________ Start time: ____________ Finish time: ____________ °Date: ____________ Movements: ____________ Start time: ____________ Finish time: ____________ °Date: ____________ Movements: ____________ Start time: ____________ Finish time: ____________ °Date: ____________ Movements: ____________ Start time: ____________ Finish time: ____________ °Date: ____________ Movements: ____________ Start time: ____________ Finish time: ____________ °Date: ____________ Movements: ____________ Start time: ____________ Finish time: ____________  °Date:  ____________ Movements: ____________ Start time: ____________ Finish time: ____________ °Date: ____________ Movements: ____________ Start time: ____________ Finish time: ____________ °Date: ____________ Movements: ____________ Start time: ____________ Finish time: ____________ °Date: ____________ Movements: ____________ Start time: ____________ Finish time: ____________ °Date: ____________ Movements: ____________ Start time: ____________ Finish time: ____________ °Date: ____________ Movements: ____________ Start time: ____________ Finish time: ____________ °Date: ____________ Movements: ____________ Start time: ____________ Finish time: ____________  °Date: ____________ Movements: ____________ Start time: ____________ Finish time: ____________ °Date: ____________ Movements: ____________ Start time: ____________ Finish time: ____________ °Date: ____________ Movements: ____________ Start time: ____________ Finish time: ____________ °Date: ____________ Movements: ____________ Start time: ____________ Finish time: ____________ °Date: ____________ Movements: ____________ Start time: ____________ Finish time: ____________ °Date: ____________ Movements: ____________ Start time: ____________ Finish time: ____________ °  °This information is not intended to replace advice given to you by your health care provider. Make sure you discuss any questions you have with your health care provider. °  °Document Released: 07/01/2006 Document Revised: 06/22/2014 Document Reviewed: 03/28/2012 °[employer] Interactive Patient Education ©2016 [employer] °Braxton Hicks Contractions °Contractions of the uterus can occur throughout pregnancy. Contractions are not always a sign that you are in labor.  °WHAT ARE BRAXTON HICKS CONTRACTIONS?  °Contractions that occur before labor are called Braxton Hicks contractions, or false labor. Toward the end of pregnancy (32-34 weeks), these contractions can develop more often and may become more  forceful. This is not true labor because these contractions do not result in opening (dilatation) and thinning of the cervix. They are sometimes difficult to tell apart from true labor because these contractions can be forceful and people have different pain tolerances. You should   not feel embarrassed if you go to the hospital with false labor. Sometimes, the only way to tell if you are in true labor is for your health care provider to look for changes in the cervix. °If there are no prenatal problems or other health problems associated with the pregnancy, it is completely safe to be sent home with false labor and await the onset of true labor. °HOW CAN YOU TELL THE DIFFERENCE BETWEEN TRUE AND FALSE LABOR? °False Labor °· The contractions of false labor are usually shorter and not as hard as those of true labor.   °· The contractions are usually irregular.   °· The contractions are often felt in the front of the lower abdomen and in the groin.   °· The contractions may go away when you walk around or change positions while lying down.   °· The contractions get weaker and are shorter lasting as time goes on.   °· The contractions do not usually become progressively stronger, regular, and closer together as with true labor.   °True Labor °· Contractions in true labor last 30-70 seconds, become very regular, usually become more intense, and increase in frequency.   °· The contractions do not go away with walking.   °· The discomfort is usually felt in the top of the uterus and spreads to the lower abdomen and low back.   °· True labor can be determined by your health care provider with an exam. This will show that the cervix is dilating and getting thinner.   °WHAT TO REMEMBER °· Keep up with your usual exercises and follow other instructions given by your health care provider.   °· Take medicines as directed by your health care provider.   °· Keep your regular prenatal appointments.   °· Eat and drink lightly if you  think you are going into labor.   °· If Braxton Hicks contractions are making you uncomfortable:   °· Change your position from lying down or resting to walking, or from walking to resting.   °· Sit and rest in a tub of warm water.   °· Drink 2-3 glasses of water. Dehydration may cause these contractions.   °· Do slow and deep breathing several times an hour.   °WHEN SHOULD I SEEK IMMEDIATE MEDICAL CARE? °Seek immediate medical care if: °· Your contractions become stronger, more regular, and closer together.   °· You have fluid leaking or gushing from your vagina.   °· You have a fever.   °· You pass blood-tinged mucus.   °· You have vaginal bleeding.   °· You have continuous abdominal pain.   °· You have low back pain that you never had before.   °· You feel your baby's head pushing down and causing pelvic pressure.   °· Your baby is not moving as much as it used to.   °  °This information is not intended to replace advice given to you by your health care provider. Make sure you discuss any questions you have with your health care provider. °  °Document Released: 06/01/2005 Document Revised: 06/06/2013 Document Reviewed: 03/13/2013 °Elsevier Interactive Patient Education ©2016 [employer] ° °

## 2015-12-10 NOTE — Progress Notes (Signed)
Notified of pt arrival in MAU and exam with negative fern. Will discharge pt home

## 2015-12-12 ENCOUNTER — Inpatient Hospital Stay (HOSPITAL_COMMUNITY): Payer: Medicaid Other | Admitting: Anesthesiology

## 2015-12-12 ENCOUNTER — Ambulatory Visit (INDEPENDENT_AMBULATORY_CARE_PROVIDER_SITE_OTHER): Payer: Medicaid Other | Admitting: Advanced Practice Midwife

## 2015-12-12 ENCOUNTER — Ambulatory Visit: Payer: Medicaid Other | Admitting: Advanced Practice Midwife

## 2015-12-12 ENCOUNTER — Inpatient Hospital Stay (HOSPITAL_COMMUNITY)
Admission: AD | Admit: 2015-12-12 | Discharge: 2015-12-14 | DRG: 775 | Disposition: A | Discharge status: Home / Self Care | Payer: Medicaid Other | Source: Ambulatory Visit | Attending: Family Medicine | Admitting: Family Medicine

## 2015-12-12 ENCOUNTER — Encounter: Payer: Self-pay | Admitting: Advanced Practice Midwife

## 2015-12-12 ENCOUNTER — Encounter (HOSPITAL_COMMUNITY): Payer: Self-pay | Admitting: *Deleted

## 2015-12-12 VITALS — BP 118/70 | HR 88 | Wt 205.0 lb

## 2015-12-12 DIAGNOSIS — Z3A39 39 weeks gestation of pregnancy: Secondary | ICD-10-CM | POA: Diagnosis not present

## 2015-12-12 DIAGNOSIS — O4440 Low lying placenta NOS or without hemorrhage, unspecified trimester: Secondary | ICD-10-CM

## 2015-12-12 DIAGNOSIS — IMO0001 Reserved for inherently not codable concepts without codable children: Secondary | ICD-10-CM

## 2015-12-12 DIAGNOSIS — Z3A38 38 weeks gestation of pregnancy: Secondary | ICD-10-CM | POA: Diagnosis not present

## 2015-12-12 DIAGNOSIS — Z1389 Encounter for screening for other disorder: Secondary | ICD-10-CM

## 2015-12-12 DIAGNOSIS — Z23 Encounter for immunization: Secondary | ICD-10-CM

## 2015-12-12 DIAGNOSIS — Z91040 Latex allergy status: Secondary | ICD-10-CM | POA: Diagnosis not present

## 2015-12-12 DIAGNOSIS — Z3493 Encounter for supervision of normal pregnancy, unspecified, third trimester: Secondary | ICD-10-CM

## 2015-12-12 DIAGNOSIS — Z331 Pregnant state, incidental: Secondary | ICD-10-CM

## 2015-12-12 DIAGNOSIS — O321XX0 Maternal care for breech presentation, not applicable or unspecified: Secondary | ICD-10-CM | POA: Diagnosis present

## 2015-12-12 DIAGNOSIS — Z3483 Encounter for supervision of other normal pregnancy, third trimester: Secondary | ICD-10-CM

## 2015-12-12 LAB — CBC
HEMATOCRIT: 28 % — AB (ref 36.0–46.0)
HEMOGLOBIN: 9.2 g/dL — AB (ref 12.0–15.0)
MCH: 25.6 pg — AB (ref 26.0–34.0)
MCHC: 32.9 g/dL (ref 30.0–36.0)
MCV: 78 fL (ref 78.0–100.0)
Platelets: 184 10*3/uL (ref 150–400)
RBC: 3.59 MIL/uL — ABNORMAL LOW (ref 3.87–5.11)
RDW: 14.7 % (ref 11.5–15.5)
WBC: 13.6 10*3/uL — ABNORMAL HIGH (ref 4.0–10.5)

## 2015-12-12 LAB — POCT URINALYSIS DIPSTICK
Glucose, UA: NEGATIVE
KETONES UA: NEGATIVE
NITRITE UA: NEGATIVE
RBC UA: NEGATIVE

## 2015-12-12 MED ORDER — ONDANSETRON HCL 4 MG/2ML IJ SOLN: 4.0000 mg | Freq: Four times a day (QID) | INTRAMUSCULAR | Status: DC | PRN

## 2015-12-12 MED ORDER — OXYTOCIN 40 UNITS IN LACTATED RINGERS INFUSION - SIMPLE MED
2.5000 [IU]/h | INTRAVENOUS | Status: DC
  Filled 2015-12-12: qty 1000

## 2015-12-12 MED ORDER — DIPHENHYDRAMINE HCL 50 MG/ML IJ SOLN: 12.5000 mg | INTRAMUSCULAR | Status: DC | PRN

## 2015-12-12 MED ORDER — LIDOCAINE HCL (PF) 1 % IJ SOLN
INTRAMUSCULAR | Status: DC | PRN
Start: 2015-12-12 — End: 2015-12-12
  Administered 2015-12-12 (×2): 6 mL via EPIDURAL

## 2015-12-12 MED ORDER — LACTATED RINGERS IV SOLN: 500.0000 mL | Freq: Once | INTRAVENOUS | Status: DC

## 2015-12-12 MED ORDER — LACTATED RINGERS IV SOLN: 500.0000 mL | INTRAVENOUS | Status: DC | PRN

## 2015-12-12 MED ORDER — PHENYLEPHRINE 40 MCG/ML (10ML) SYRINGE FOR IV PUSH (FOR BLOOD PRESSURE SUPPORT)
80.0000 ug | PREFILLED_SYRINGE | INTRAVENOUS | Status: DC | PRN
  Filled 2015-12-12: qty 10
  Filled 2015-12-12: qty 5

## 2015-12-12 MED ORDER — EPHEDRINE 5 MG/ML INJ
10.0000 mg | INTRAVENOUS | Status: DC | PRN
Start: 2015-12-12 — End: 2015-12-13
  Filled 2015-12-12: qty 2

## 2015-12-12 MED ORDER — PHENYLEPHRINE 40 MCG/ML (10ML) SYRINGE FOR IV PUSH (FOR BLOOD PRESSURE SUPPORT): 80.0000 ug | PREFILLED_SYRINGE | INTRAVENOUS | Status: DC | PRN

## 2015-12-12 MED ORDER — SOD CITRATE-CITRIC ACID 500-334 MG/5ML PO SOLN: 30.0000 mL | ORAL | Status: DC | PRN

## 2015-12-12 MED ORDER — EPHEDRINE 5 MG/ML INJ: 10.0000 mg | INTRAVENOUS | Status: DC | PRN

## 2015-12-12 MED ORDER — FENTANYL 2.5 MCG/ML BUPIVACAINE 1/10 % EPIDURAL INFUSION (WH - ANES)
14.0000 mL/h | INTRAMUSCULAR | Status: DC | PRN
  Administered 2015-12-12: 14 mL/h via EPIDURAL
  Filled 2015-12-12: qty 125

## 2015-12-12 MED ORDER — EPHEDRINE 5 MG/ML INJ
10.0000 mg | INTRAVENOUS | Status: DC | PRN
  Filled 2015-12-12: qty 2

## 2015-12-12 MED ORDER — ACETAMINOPHEN 325 MG PO TABS: 650.0000 mg | ORAL_TABLET | ORAL | Status: DC | PRN

## 2015-12-12 MED ORDER — OXYTOCIN BOLUS FROM INFUSION
500.0000 mL | INTRAVENOUS | Status: DC
  Administered 2015-12-12: 500 mL via INTRAVENOUS

## 2015-12-12 MED ORDER — FENTANYL CITRATE (PF) 100 MCG/2ML IJ SOLN
100.0000 ug | INTRAMUSCULAR | Status: DC | PRN
  Administered 2015-12-12 (×3): 100 ug via INTRAVENOUS
  Filled 2015-12-12 (×2): qty 2

## 2015-12-12 MED ORDER — FENTANYL CITRATE (PF) 100 MCG/2ML IJ SOLN
50.0000 ug | INTRAMUSCULAR | Status: DC | PRN
Start: 2015-12-12 — End: 2015-12-13
  Filled 2015-12-12 (×2): qty 2

## 2015-12-12 MED ORDER — LIDOCAINE HCL (PF) 1 % IJ SOLN
30.0000 mL | INTRAMUSCULAR | Status: DC | PRN
  Filled 2015-12-12: qty 30

## 2015-12-12 MED ORDER — FLEET ENEMA 7-19 GM/118ML RE ENEM: 1.0000 | ENEMA | RECTAL | Status: DC | PRN

## 2015-12-12 MED ORDER — LACTATED RINGERS IV SOLN
INTRAVENOUS | Status: DC
  Administered 2015-12-12: 17:00:00 via INTRAVENOUS

## 2015-12-12 MED ORDER — OXYCODONE-ACETAMINOPHEN 5-325 MG PO TABS
2.0000 | ORAL_TABLET | ORAL | Status: DC | PRN
Start: 2015-12-12 — End: 2015-12-13

## 2015-12-12 MED ORDER — OXYCODONE-ACETAMINOPHEN 5-325 MG PO TABS
1.0000 | ORAL_TABLET | ORAL | Status: DC | PRN
Start: 2015-12-12 — End: 2015-12-13

## 2015-12-12 NOTE — Progress Notes (Signed)
Direct admit for labor

## 2015-12-12 NOTE — Anesthesia Procedure Notes (Signed)
Epidural  Start time: 12/12/2015 8:50 PM End time: 12/12/2015 9:04 PM  Staffing Anesthesiologist: Jairo BenJACKSON, Hobie Kohles Performed by: anesthesiologist   Preanesthetic Checklist Completed: patient identified, surgical consent, pre-op evaluation, timeout performed, IV checked, risks and benefits discussed and monitors and equipment checked  Epidural Patient position: sitting Prep: site prepped and draped and DuraPrep Patient monitoring: heart rate, continuous pulse ox and blood pressure Approach: midline Location: L3-L4 Injection technique: LOR air  Needle:  Needle type: Tuohy  Needle gauge: 17 G Needle length: 9 cm Needle insertion depth: 5.5 cm Catheter type: closed end flexible Catheter size: 19 Gauge Catheter at skin depth: 11 cm Test dose: negative (1% lido )  Additional Notes LOR 5.5cm Cath 11cm skin Pt identified in Labor room.  Monitors applied. Working IV access confirmed. Sterile prep, drape lumbar spine.  1% lido local L 3,4.  #17ga Touhy LOR air L3,4. Cath in easily to 11 cm skin. 1% lido test OK,  Dosed and infusion begun.  Patient asymptomatic, VSS, no heme aspirated, tolerated well.  Sandford Craze Joeli Fenner, MD Reason for block:procedure for pain

## 2015-12-12 NOTE — Anesthesia Preprocedure Evaluation (Signed)
Anesthesia Evaluation  Patient identified by MRN, date of birth, ID band Patient awake    Reviewed: Allergy & Precautions, NPO status , Patient's Chart, lab work & pertinent test results  History of Anesthesia Complications Negative for: history of anesthetic complications  Airway Mallampati: II  TM Distance: >3 FB Neck ROM: Full    Dental  (+) Dental Advisory Given   Pulmonary neg pulmonary ROS,    breath sounds clear to auscultation       Cardiovascular negative cardio ROS   Rhythm:Regular Rate:Normal     Neuro/Psych negative neurological ROS     GI/Hepatic Neg liver ROS, GERD  ,  Endo/Other  negative endocrine ROS  Renal/GU negative Renal ROS     Musculoskeletal   Abdominal   Peds  Hematology plt 184k, Hb 9.2   Anesthesia Other Findings   Reproductive/Obstetrics                             Anesthesia Physical Anesthesia Plan  ASA: I  Anesthesia Plan: Epidural   Post-op Pain Management:    Induction:   Airway Management Planned: Natural Airway  Additional Equipment:   Intra-op Plan:   Post-operative Plan:   Informed Consent: I have reviewed the patients History and Physical, chart, labs and discussed the procedure including the risks, benefits and alternatives for the proposed anesthesia with the patient or authorized representative who has indicated his/her understanding and acceptance.   Dental advisory given  Plan Discussed with:   Anesthesia Plan Comments: (Patient identified. Risks/Benefits/Options discussed with patient including but not limited to bleeding, infection, nerve damage, paralysis, failed block, incomplete pain control, headache, blood pressure changes, nausea, vomiting, reactions to medication both or allergic, itching and postpartum back pain. Confirmed with bedside nurse the patient's most recent platelet count. Confirmed with patient that they are  not currently taking any anticoagulation, have any bleeding history or any family history of bleeding disorders. Patient expressed understanding and wished to proceed. All questions were answered.  )        Anesthesia Quick Evaluation

## 2015-12-12 NOTE — Anesthesia Pain Management Evaluation Note (Signed)
  [facility] Pain Management Visit Note  Patient: Paula Sims, 30 y.o., female  "Hello I am a [facility] of the anesthesia [facility] at [facility]. We have an anesthesia team available at all times to provide care throughout the hospital, including epidural management and anesthesia for C-section. I don't know your plan for the delivery whether it a natural birth, water birth, IV sedation, nitrous supplementation, doula or epidural, but we want to meet your pain goals."   1.Was your pain managed to your expectations on prior hospitalizations?   Yes   2.What is your expectation for pain management during this hospitalization?     Epidural  3.How can we help you reach that goal? Epidural just placed  Record the patient's initial score and the patient's pain goal.   Pain: 4  Pain Goal: 5 [facility] wants you to be able to say your pain was always managed very well.  Ahad Colarusso 12/12/2015

## 2015-12-12 NOTE — Progress Notes (Signed)
   Paula Sims is a [age] y.o. [phone] at 919w2d  admitted for active labor  Subjective: Wants epidural  Objective: Filed Vitals:   12/12/15 1715 12/12/15 1721 12/12/15 1856  BP:  123/73 124/68  Pulse:  89 89  Temp: 97.5 F (36.4 C)    TempSrc: Oral    Height: 5\' 4"  (1.626 m)    Weight: 92.534 kg (204 lb)        FHT:  FHR: 125 bpm, variability: moderate,  accelerations:  Present,  decelerations:  Absent UC:   irregular, every 3-4 minutes SVE:   Dilation: 5 Effacement (%): 80 Station: -2 Exam by:: Drenda FreezeFran C. CNM  AROM w/clear fluid  Labs: Lab Results  Component Value Date   WBC 13.6* 12/12/2015   HGB 9.2* 12/12/2015   HCT 28.0* 12/12/2015   MCV 78.0 12/12/2015   PLT 184 12/12/2015    Assessment / Plan: Spontaneous labor, progressing normally  Labor: Progressing normally Fetal Wellbeing:  Category I Pain Control:  IV pain meds Anticipated MOD:  NSVD  CRESENZO-DISHMAN,Heaven Meeker 12/12/2015, 7:48 PM

## 2015-12-12 NOTE — Progress Notes (Signed)
[id number] 4682w2d Estimated Date of Delivery: 12/24/15  Blood pressure 118/70, pulse 88, weight 205 lb (92.987 kg), last menstrual period 03/27/2015.   BP weight and urine results all reviewed and noted.  Please refer to the obstetrical flow sheet for the fundal height and fetal heart rate documentation:  Patient reports good fetal movement, denies any bleeding and no rupture of membranes symptoms or regular contractions. Patient is without complaints. All questions were answered.  Orders Placed This Encounter  Procedures  . POCT urinalysis dipstick    Plan:  Continued routine obstetrical care,   Return in about 1 week (around 12/19/2015) for LROB.

## 2015-12-12 NOTE — H&P (Signed)
Paula Sims is a 30 y.o. female [phone] with IUP at 6846w2d presenting for contractons. Pt states she has been having regular, every 5-6 minutes contractions, associated with scant staining vaginal bleeding for 4 hours..  Membranes are intact, with active fetal movement.   [facility] Pregnancy was result of IUI, transferred care  Prenatal History/Complications:  Term SVD w/o problems  Past Medical History: Past Medical History  Diagnosis Date  . Seropositive for herpes simplex 2 infection 2010    Past Surgical History: Past Surgical History  Procedure Laterality Date  . Eye implants  2015  . Foot surgery  2013    Obstetrical History: OB History    Gravida Para Term Preterm AB TAB SAB Ectopic Multiple Living   4 1 1  2  2   1        Social History: Social History   Social History  . Marital Status: Married    Spouse Name: N/A  . Number of Children: N/A  . Years of Education: N/A   Social History Main Topics  . Smoking status: Never Smoker   . Smokeless tobacco: Never Used  . Alcohol Use: No  . Drug Use: No  . Sexual Activity: Yes   Other Topics Concern  . Not on file   Social History Narrative    Family History: Family History  Problem Relation Age of Onset  . Hypertension Father   . Hypertension Mother   . Hypothyroidism Mother   . Hypothyroidism Sister   . Hypertension Maternal Grandmother   . Cancer Maternal Grandmother   . Hypothyroidism Maternal Grandmother   . Hypertension Maternal Grandfather   . Hypertension Paternal Grandmother   . Hypertension Paternal Grandfather     Allergies: Allergies  Allergen Reactions  . Latex Rash    Burning, itching. Pt states specifically latex catheters     (Not in a hospital admission)   Prenatal Transfer Tool  Maternal Diabetes: No Genetic Screening: Normal Maternal Ultrasounds/Referrals: Normal Fetal Ultrasounds or other Referrals:  None Maternal Substance Abuse:   No Significant Maternal Medications:  None Significant Maternal Lab Results: Lab values include: Group B Strep negative     Review of Systems   Constitutional: Negative for fever and chills Eyes: Negative for visual disturbances Respiratory: Negative for shortness of breath, dyspnea Cardiovascular: Negative for chest pain or palpitations  Gastrointestinal: Negative for vomiting, diarrhea and constipation.  POSITIVE for abdominal pain (contractions) Genitourinary: Negative for dysuria and urgency Musculoskeletal: Negative for back pain, joint pain, myalgias  Neurological: Negative for dizziness and headaches      Last menstrual period 03/27/2015. General appearance: alert, cooperative and no distress Lungs: clear to auscultation bilaterally Heart: regular rate and rhythm Abdomen: soft, non-tender; bowel sounds normal Pelvic: 4-5/80/-2 Extremities: Homans sign is negative, no sign of DVT DTR's 2+ Presentation: cephalic Fetal monitoring  150 doppler Uterine activity  4-5 minutres, moerate     [facility]  Initiated Care at  6 weeks  FOB Adline PotterAndy Ratterree  Dating By IUI date/6 week US  Pap 06/20/2015 normal  GC/CT Initial: -/-             36+wks:-/-  Genetic Screen NT/IT: neg  CF screen neg  Anatomic US Normal female, 'Clydene Pughsher'  Flu vaccine Declined 09/24/2015   Tdap Recommended ~ 28wks  Glucose Screen  2 hr normal: 78/134/69  GBS neg  Feed Preference breast  Contraception Undecided, discussed   Circumcision yes  Childbirth Classes declined  [facility]     Prenatal labs: ABO, Rh: A/Negative/-- (01/05 1535) Antibody: Negative (04/20 0937) Rubella: !Error!immune RPR: Non Reactive (04/20 0937)  HBsAg: Negative (01/05 1535)  HIV: Non Reactive (04/20 16100937)  GBS: Negative (06/21 1630)    Results for orders placed or performed in visit on 12/12/15 (from the past 24 hour(s))  POCT urinalysis dipstick   Collection Time: 12/12/15  8:42 AM   Result Value Ref Range   Color, UA     Clarity, UA     Glucose, UA neg    Bilirubin, UA     Ketones, UA neg    Spec Grav, UA     Blood, UA neg    pH, UA     Protein, UA trace    Urobilinogen, UA     Nitrite, UA neg    Leukocytes, UA small (1+) (A) Negative    Assessment: Paula Sims is a 30 y.o. [phone] with an IUP at 6471w2d presenting for active labor  Plan: #Labor: expectant management #Pain:  Per request #FWB Cat 1    CRESENZO-DISHMAN,Akeyla Molden 12/12/2015, 3:45 PM

## 2015-12-12 NOTE — Progress Notes (Signed)
Pt states that she has noticed some extra discharge.

## 2015-12-12 NOTE — Progress Notes (Signed)
   Fredia BeetsKayleigh G Mathew is a 30 y.o. [phone] at 1875w2d  admitted for active labor  Subjective:  Comfortable with epidural Objective: Filed Vitals:   12/12/15 2145 12/12/15 2150 12/12/15 2200 12/12/15 2230  BP: 114/61 118/65 119/62 116/64  Pulse: 76 81 81 72  Temp:      TempSrc:      Resp:   18 17  Height:      Weight:      SpO2:          FHT:  FHR: 130 bpm, variability: moderate,  accelerations:  Present,  decelerations:  Absent UC:   regular, every 2-3 minutes SVE:   7-8/90/-1  Labs: Lab Results  Component Value Date   WBC 13.6* 12/12/2015   HGB 9.2* 12/12/2015   HCT 28.0* 12/12/2015   MCV 78.0 12/12/2015   PLT 184 12/12/2015    Assessment / Plan: Spontaneous labor, progressing normally  Labor: Progressing normally Fetal Wellbeing:  Category I Pain Control:  Epidural Anticipated MOD:  NSVD  CRESENZO-DISHMAN,Shylee Durrett 12/12/2015, 10:57 PM

## 2015-12-13 ENCOUNTER — Encounter (HOSPITAL_COMMUNITY): Payer: Self-pay

## 2015-12-13 DIAGNOSIS — Z3A38 38 weeks gestation of pregnancy: Secondary | ICD-10-CM

## 2015-12-13 LAB — RPR: RPR: NONREACTIVE

## 2015-12-13 MED ORDER — ONDANSETRON HCL 4 MG/2ML IJ SOLN: 4.0000 mg | INTRAMUSCULAR | Status: DC | PRN

## 2015-12-13 MED ORDER — SIMETHICONE 80 MG PO CHEW: 80.0000 mg | CHEWABLE_TABLET | ORAL | Status: DC | PRN

## 2015-12-13 MED ORDER — METHYLERGONOVINE MALEATE 0.2 MG/ML IJ SOLN: 0.2000 mg | INTRAMUSCULAR | Status: DC | PRN

## 2015-12-13 MED ORDER — DIBUCAINE 1 % RE OINT: 1.0000 "application " | TOPICAL_OINTMENT | RECTAL | Status: DC | PRN

## 2015-12-13 MED ORDER — METHYLERGONOVINE MALEATE 0.2 MG PO TABS: 0.2000 mg | ORAL_TABLET | ORAL | Status: DC | PRN

## 2015-12-13 MED ORDER — TETANUS-DIPHTH-ACELL PERTUSSIS 5-2.5-18.5 LF-MCG/0.5 IM SUSP
0.5000 mL | Freq: Once | INTRAMUSCULAR | Status: AC
  Administered 2015-12-14: 0.5 mL via INTRAMUSCULAR
  Filled 2015-12-13: qty 0.5

## 2015-12-13 MED ORDER — PRENATAL MULTIVITAMIN CH
1.0000 | ORAL_TABLET | Freq: Every day | ORAL | Status: DC
  Administered 2015-12-13 – 2015-12-14 (×2): 1 via ORAL
  Filled 2015-12-13 (×2): qty 1

## 2015-12-13 MED ORDER — IBUPROFEN 600 MG PO TABS
600.0000 mg | ORAL_TABLET | Freq: Four times a day (QID) | ORAL | Status: DC
  Administered 2015-12-13 – 2015-12-14 (×7): 600 mg via ORAL
  Filled 2015-12-13 (×7): qty 1

## 2015-12-13 MED ORDER — BENZOCAINE-MENTHOL 20-0.5 % EX AERO
1.0000 "application " | INHALATION_SPRAY | CUTANEOUS | Status: DC | PRN
  Administered 2015-12-13: 1 via TOPICAL
  Filled 2015-12-13: qty 56

## 2015-12-13 MED ORDER — ONDANSETRON HCL 4 MG PO TABS: 4.0000 mg | ORAL_TABLET | ORAL | Status: DC | PRN

## 2015-12-13 MED ORDER — FLEET ENEMA 7-19 GM/118ML RE ENEM: 1.0000 | ENEMA | Freq: Every day | RECTAL | Status: DC | PRN

## 2015-12-13 MED ORDER — ACETAMINOPHEN 325 MG PO TABS
650.0000 mg | ORAL_TABLET | ORAL | Status: DC | PRN
  Administered 2015-12-13: 650 mg via ORAL
  Filled 2015-12-13 (×2): qty 2

## 2015-12-13 MED ORDER — OXYCODONE HCL 5 MG PO TABS
10.0000 mg | ORAL_TABLET | ORAL | Status: DC | PRN
  Administered 2015-12-13 – 2015-12-14 (×4): 10 mg via ORAL
  Filled 2015-12-13 (×4): qty 2

## 2015-12-13 MED ORDER — FERROUS SULFATE 325 (65 FE) MG PO TABS
325.0000 mg | ORAL_TABLET | Freq: Two times a day (BID) | ORAL | Status: DC
  Administered 2015-12-13 – 2015-12-14 (×2): 325 mg via ORAL
  Filled 2015-12-13 (×3): qty 1

## 2015-12-13 MED ORDER — DOCUSATE SODIUM 100 MG PO CAPS
100.0000 mg | ORAL_CAPSULE | Freq: Two times a day (BID) | ORAL | Status: DC
  Administered 2015-12-13: 100 mg via ORAL
  Filled 2015-12-13: qty 1

## 2015-12-13 MED ORDER — MEASLES, MUMPS & RUBELLA VAC ~~LOC~~ INJ
0.5000 mL | INJECTION | Freq: Once | SUBCUTANEOUS | Status: DC
  Filled 2015-12-13: qty 0.5

## 2015-12-13 MED ORDER — DIPHENHYDRAMINE HCL 25 MG PO CAPS
25.0000 mg | ORAL_CAPSULE | Freq: Four times a day (QID) | ORAL | Status: DC | PRN
  Administered 2015-12-13 (×2): 25 mg via ORAL
  Filled 2015-12-13 (×2): qty 1

## 2015-12-13 MED ORDER — ZOLPIDEM TARTRATE 5 MG PO TABS: 5.0000 mg | ORAL_TABLET | Freq: Every evening | ORAL | Status: DC | PRN

## 2015-12-13 MED ORDER — BISACODYL 10 MG RE SUPP: 10.0000 mg | Freq: Every day | RECTAL | Status: DC | PRN

## 2015-12-13 MED ORDER — COCONUT OIL OIL
1.0000 "application " | TOPICAL_OIL | Status: DC | PRN
  Filled 2015-12-13: qty 120

## 2015-12-13 MED ORDER — RHO D IMMUNE GLOBULIN 1500 UNIT/2ML IJ SOSY
300.0000 ug | PREFILLED_SYRINGE | Freq: Once | INTRAMUSCULAR | Status: AC
  Administered 2015-12-13: 300 ug via INTRAVENOUS
  Filled 2015-12-13: qty 2

## 2015-12-13 MED ORDER — WITCH HAZEL-GLYCERIN EX PADS: 1.0000 "application " | MEDICATED_PAD | CUTANEOUS | Status: DC | PRN

## 2015-12-13 MED ORDER — OXYCODONE HCL 5 MG PO TABS
5.0000 mg | ORAL_TABLET | ORAL | Status: DC | PRN
  Administered 2015-12-13 (×3): 5 mg via ORAL
  Filled 2015-12-13 (×3): qty 1

## 2015-12-13 NOTE — Anesthesia Postprocedure Evaluation (Signed)
Anesthesia Post Note  Patient: Fredia BeetsKayleigh G Roark  Procedure(s) Performed: * No procedures listed *  Patient location during evaluation: Mother Baby Anesthesia Type: Epidural Level of consciousness: awake and alert and oriented Pain management: satisfactory to patient Vital Signs Assessment: post-procedure vital signs reviewed and stable Respiratory status: spontaneous breathing and nonlabored ventilation Cardiovascular status: stable Postop Assessment: no headache, no backache, no signs of nausea or vomiting, adequate PO intake and patient able to bend at knees (patient up walking) Anesthetic complications: no     Last Vitals:  Filed Vitals:   12/13/15 0250 12/13/15 0556  BP: 111/65 114/60  Pulse: 82 68  Temp: 36.5 C 36.7 C  Resp: 18 20    Last Pain:  Filed Vitals:   12/13/15 0607  PainSc: 3    Pain Goal: Patients Stated Pain Goal: 2 (12/13/15 0606)               Madison HickmanGREGORY,Lurena Naeve

## 2015-12-13 NOTE — Progress Notes (Signed)
Post Partum Day 1 Subjective: no complaints, up ad lib, voiding and tolerating PO, small lochia, plans to breastfeed, nexplanon  Objective: Blood pressure 114/60, pulse 68, temperature 98 F (36.7 C), temperature source Oral, resp. rate 20, height 5\' 4"  (1.626 m), weight 92.534 kg (204 lb), last menstrual period 03/27/2015, SpO2 99 %, unknown if currently breastfeeding.  Physical Exam:  General: alert, cooperative and no distress Lochia:normal flow Chest: CTAB Heart: RRR no m/r/g Abdomen: +BS, soft, nontender,  Uterine Fundus: firm DVT Evaluation: No evidence of DVT seen on physical exam. Extremities: trace edema   Recent Labs  12/12/15 1700  HGB 9.2*  HCT 28.0*    Assessment/Plan: Plan for discharge tomorrow and Lactation consult   LOS: 1 day   CRESENZO-DISHMAN,FRANCES, CNM 12/13/2015, 6:46 AM    Attestation of Attending Supervision of Advanced Practice Provider (PA/CNM/NP): Evaluation and management procedures were performed by the Advanced Practice Provider under my supervision and collaboration.  I have reviewed the Advanced Practice Provider's note and chart, and I agree with the management and plan.  Jaynie CollinsUGONNA  ANYANWU, MD, FACOG Attending Obstetrician & Gynecologist Faculty Practice, [facility] [place]

## 2015-12-13 NOTE — Lactation Note (Signed)
This note was copied from a baby's chart. Lactation Consultation Note  Patient Name: Paula Olive BassKayleigh Lofland ZOXWR'UToday's Date: 12/13/2015 Reason for consult: Initial assessment;Difficult latch Baby 16 hours old. Baby has bruising on face and head, and mom reports that baby has been sleepy at breast. Mom states that she has been attempting to latch, but baby has only latched once to left breast and nursed off-and-on for 15 minutes. Demonstrated to mom how to hand express with colostrum flowing from left breast, and only moisture at right breast. Assisted mom to latch baby to right breast in football position. After undressing the baby and making repeated attempts to latch, fitted mom with a #24 NS. Baby latched and suckled rhythmically with lips flanged, but no swallows noted. After nursing 15 minutes, baby pulled off and there was no colostrum in NS. Discussed with mom that NS a temporary device and discussed that it assists baby to nurse and pull mom's nipple out. Discussed with mom that the DEBP will also aid in everting mom's nipple. Assisted mom to hand express and spoon feed baby 3 ml of colostrum. Baby tolerated well.  Assisted mom to begin use of DEBP, and mom's colostrum began flowing freely from both breasts. Plan is for mom to put baby to breast with cues, and at least every 3 hours since baby has not been nursing. Enc mom to supplement baby with EBM after baby at breast, and then post-pump for 15 minutes followed by hand expression. Discussed assessment and interventions with patient's bedside nurse, Luther Parodyaitlin, RN.   Maternal Data Has patient been taught Hand Expression?: Yes Does the patient have breastfeeding experience prior to this delivery?: Yes  Feeding Feeding Type: Breast Milk Length of feed: 20 min  LATCH Score/Interventions Latch: Grasps breast easily, tongue down, lips flanged, rhythmical sucking. Intervention(s): Skin to skin;Waking techniques Intervention(s): Adjust position;Assist  with latch;Breast massage;Breast compression  Audible Swallowing: None Intervention(s): Skin to skin;Hand expression  Type of Nipple: Flat (short shaft) Intervention(s): Double electric pump  Comfort (Breast/Nipple): Soft / non-tender     Hold (Positioning): Assistance needed to correctly position infant at breast and maintain latch. Intervention(s): Breastfeeding basics reviewed;Support Pillows;Position options;Skin to skin  LATCH Score: 6  Lactation Tools Discussed/Used Tools: Nipple Shields Nipple shield size: 24 Pump Review: Setup, frequency, and cleaning;Milk Storage Initiated by:: JW Date initiated:: 12/13/15   Consult Status Consult Status: Follow-up Date: 12/14/15 Follow-up type: In-patient    Geralynn OchsWILLIARD, Cedrik Heindl 12/13/2015, 4:03 PM

## 2015-12-14 DIAGNOSIS — O321XX0 Maternal care for breech presentation, not applicable or unspecified: Secondary | ICD-10-CM

## 2015-12-14 DIAGNOSIS — Z91040 Latex allergy status: Secondary | ICD-10-CM

## 2015-12-14 DIAGNOSIS — Z3A38 38 weeks gestation of pregnancy: Secondary | ICD-10-CM

## 2015-12-14 LAB — RH IG WORKUP (INCLUDES ABO/RH)
ABO/RH(D): A NEG
Fetal Screen: NEGATIVE
GESTATIONAL AGE(WKS): 38
Unit division: 0

## 2015-12-14 MED ORDER — IBUPROFEN 600 MG PO TABS: 600.0000 mg | ORAL_TABLET | Freq: Four times a day (QID) | ORAL | Status: DC

## 2015-12-14 MED ORDER — OXYCODONE-ACETAMINOPHEN 5-325 MG PO TABS: 1.0000 | ORAL_TABLET | ORAL | Status: DC | PRN

## 2015-12-14 NOTE — Progress Notes (Signed)
Discharge instructions for mom and baby reviewed with patient.  Patient states understanding of home care for self and baby, medications, activity, signs/symptoms to report to MD and return MD office visit.  Patients significant other and family will assist with her care @ home.  No home  equipment needed, patient has prescriptions and all personal belongings.  Patient ambulated for discharge in stable condition with staff without incident.  Baby discharged home with mother.

## 2015-12-14 NOTE — Lactation Note (Signed)
Lactation Consultation Note Mother hand express drops of colostrum.  Her nipples are pink and tender.  Provided her with coconut oil.  Attempted latching without NS and baby latched for a few moments and mother stated it was uncomfortable.  Applied #24NS and baby latched.  Helped mother reposition for increased depth. Taught mother how to [facility] after approx 20 min of breastfeeding on R side.  Applied #20NS to L side and baby had improved depth in football hold.  Sucks and swallows observed.  Reviewed plan below:  1. Hand express before latching baby when able.  Breastfeed on demand 8-12x a day.  Rest/sleep when baby sleeps. 2. Attempt latching without nipple shield at least once a day.  If he is able continue breastfeeding without nipple shield.  If not, apply nipple shield.  Prefill nipple shield with pumped breastmilk or hand express into nipple shield.   3. Once breastmilk volume increase you can decide to use #24 (larger) nipple shield. 4. Post pump 4-5 times a day for 10-15 minutes with double electric breast pump. 5. Give baby back volume pumped at next feeding with spoon/syringe at first.  When volume increases you can choose to give volume back to baby with slow flow nipple after breastfeeding. 6. Monitor voids/stools.  Chart on page 24 Baby & Me booklet.   7. If you become engorged.  Apply ice packs or frozen bags of vegetables to breast for 10-15 min every 2-3 hours.  Usually engorgement with resolve within 24 hours.  Lie flat on bed and massage breast toward armpit and collarbone to help relieve swelling.   8. Outpatient appointment Friday 12/20/15 10:30 am.  Patient Name: Paula Sims ZOXWR'UToday's Date: 12/14/2015     Maternal Data    Feeding    LATCH Score/Interventions                      Lactation Tools Discussed/Used     Consult Status      [name], [name] 12/14/2015, 11:20 AM

## 2015-12-14 NOTE — Discharge Summary (Signed)
OB Discharge Summary     Patient Name: Paula Sims DOB: 02-03-1986 MRN: [id number]  Date of admission: 12/12/2015 Delivering Sims: Jacklyn ShellRESENZO-DISHMON, FRANCES   Date of discharge: 12/14/2015  Admitting diagnosis: labor Intrauterine pregnancy: 3417w2d     Secondary diagnosis:  Active Problems:   Active labor  Additional problems: None     Discharge diagnosis: Term Pregnancy Delivered                                                                                                Post partum procedures:None  Complications: None  Hospital course:  Onset of Labor With Vaginal Delivery     30 y.o. yo [id number] at 6317w2d was admitted in Active Labor on 12/12/2015. Patient had an uncomplicated labor course as follows:  Membrane Rupture Time/Date: 7:33 PM ,12/12/2015   Intrapartum Procedures: Episiotomy: None [1]                                         Lacerations:  None [1]  Patient had a delivery of a Viable infant. 12/12/2015  Information for the patient's newborn:  Paula Sims, Paula Sims [[id number]][[id number]]  Delivery Method: Vag-Spont    Pateint had an uncomplicated postpartum course.  She is ambulating, tolerating a regular diet, passing flatus, and urinating well. Patient is discharged home in stable condition on 12/14/2015.    Physical exam  Filed Vitals:   12/13/15 0556 12/13/15 1455 12/13/15 1844 12/13/15 2209  BP: 114/60 116/59 98/60 117/71  Pulse: 68 78 74 67  Temp: 98 F (36.7 C)  98.2 F (36.8 C) 98 F (36.7 C)  TempSrc: Oral  Oral Oral  Resp: 20 18 18 18   Height:      Weight:      SpO2: 99% 100% 100% 100%   General: alert and no distress Lochia: appropriate Uterine Fundus: firm DVT Evaluation: No evidence of DVT seen on physical exam. Negative Homan's sign. Labs: Lab Results  Component Value Date   WBC 13.6* 12/12/2015   HGB 9.2* 12/12/2015   HCT 28.0* 12/12/2015   MCV 78.0 12/12/2015   PLT 184 12/12/2015   CMP Latest Ref Rng 08/12/2010  Glucose 70 - 99 mg/dL  962(X109(H)  BUN 6 - 23 mg/dL 13  Creatinine 0.4 - 1.2 mg/dL 5.280.80  Sodium 413135 - 244145 mEq/L 136  Potassium 3.5 - 5.1 mEq/L 3.2(L)  Chloride 96 - 112 mEq/L 103  CO2 19 - 32 mEq/L 22  Calcium 8.4 - 10.5 mg/dL 9.6    Discharge instruction: per After Visit Summary and "Baby and Me Booklet".  After visit meds:    Medication List    STOP taking these medications        valACYclovir 500 MG tablet  Commonly known as:  VALTREX      TAKE these medications        acetaminophen 325 MG tablet  Commonly known as:  TYLENOL  Take 650 mg by mouth every 6 (six) hours as needed for mild pain.  calcium carbonate 500 MG chewable tablet  Commonly known as:  TUMS - dosed in mg elemental calcium  Chew 2 tablets by mouth 2 (two) times daily as needed for indigestion or heartburn.     ferrous sulfate 325 (65 FE) MG tablet  Take 1 tablet (325 mg total) by mouth 2 (two) times daily with a meal.     ibuprofen 600 MG tablet  Commonly known as:  ADVIL,MOTRIN  Take 1 tablet (600 mg total) by mouth every 6 (six) hours.     multivitamin-prenatal 27-0.8 MG Tabs tablet  Take 1 tablet by mouth daily at 12 noon.        Diet: routine diet  Activity: Advance as tolerated. Pelvic rest for 6 weeks.   Outpatient follow up:6 weeks Follow up Appt:Future Appointments Date Time Provider Department Center  01/22/2016 10:30 AM Jacklyn ShellFrances Cresenzo-Dishmon, CNM [facility]   Follow up Visit:No Follow-up on file.  Postpartum contraception: Nexplanon  Newborn Data: Live born female  Birth Weight: 7 lb 9 oz (3430 g) APGAR: , 9  Baby Feeding: Breast Disposition:home with mother   12/14/2015 Paula Sims,Paula Sims

## 2015-12-14 NOTE — Lactation Note (Signed)
Mother in shower.  FOB holding sleeping baby.  Left LC phone number and suggest family call LC for consult.

## 2015-12-14 NOTE — Discharge Instructions (Signed)

## 2015-12-15 ENCOUNTER — Encounter (HOSPITAL_COMMUNITY): Payer: Self-pay | Admitting: *Deleted

## 2015-12-16 LAB — TYPE AND SCREEN
ABO/RH(D): A NEG
ANTIBODY SCREEN: POSITIVE
DAT, IGG: NEGATIVE
UNIT DIVISION: 0
Unit division: 0

## 2015-12-18 ENCOUNTER — Encounter: Payer: Medicaid Other | Admitting: Women's Health

## 2015-12-20 ENCOUNTER — Ambulatory Visit (HOSPITAL_COMMUNITY): Payer: Medicaid Other

## 2016-01-22 ENCOUNTER — Ambulatory Visit: Payer: Medicaid Other | Admitting: Advanced Practice Midwife

## 2016-01-23 ENCOUNTER — Ambulatory Visit: Payer: Medicaid Other | Admitting: Adult Health

## 2016-01-30 ENCOUNTER — Ambulatory Visit (INDEPENDENT_AMBULATORY_CARE_PROVIDER_SITE_OTHER): Payer: Medicaid Other | Admitting: Advanced Practice Midwife

## 2016-01-30 ENCOUNTER — Encounter: Payer: Self-pay | Admitting: Advanced Practice Midwife

## 2016-01-30 DIAGNOSIS — Z3202 Encounter for pregnancy test, result negative: Secondary | ICD-10-CM

## 2016-01-30 LAB — POCT URINE PREGNANCY: Preg Test, Ur: NEGATIVE

## 2016-01-30 MED ORDER — MEGESTROL ACETATE 40 MG PO TABS: ORAL_TABLET | ORAL | 1 refills | Status: DC

## 2016-01-30 NOTE — Progress Notes (Signed)
Paula BeetsKayleigh G Sims is a 30 y.o. who presents for a postpartum visit. She is 6 weeks postpartum following a spontaneous vaginal delivery. I have fully reviewed the prenatal and intrapartum course. The delivery was at 38.2 gestational weeks.  Anesthesia: epidural. Postpartum course has been uneventful. Baby's course has been uneventful. Baby is feeding by bottle. Bleeding: no bleeding and started period last week. Bowel function is normal. Bladder function is normal. Patient is not sexually active. Contraception method is none. Postpartum depression screening: negative.   Current Outpatient Prescriptions:  .  acetaminophen (TYLENOL) 325 MG tablet, Take 650 mg by mouth every 6 (six) hours as needed for mild pain. , Disp: , Rfl:  .  calcium carbonate (TUMS - DOSED IN MG ELEMENTAL CALCIUM) 500 MG chewable tablet, Chew 2 tablets by mouth 2 (two) times daily as needed for indigestion or heartburn., Disp: , Rfl:   Review of Systems   Constitutional: Negative for fever and chills Eyes: Negative for visual disturbances Respiratory: Negative for shortness of breath, dyspnea Cardiovascular: Negative for chest pain or palpitations  Gastrointestinal: Negative for vomiting, diarrhea and constipation Genitourinary: Negative for dysuria and urgency Musculoskeletal: Negative for back pain, joint pain, myalgias  Neurological: Negative for dizziness and headaches   Objective:    122/80   General:  alert, cooperative and no distress   Breasts:  negative  Lungs: clear to auscultation bilaterally  Heart:  regular rate and rhythm  Abdomen: Soft, nontender   Vulva:  normal  Vagina: normal vagina  Cervix:  closed  Corpus: Well involuted     Rectal Exam: no hemorrhoids        Assessment:    normal postpartum exam.  Plan:    1. Contraception: Nexplanon 2. Follow up in:  At end of month (medicaid runs out)  (no sex)

## 2016-02-12 ENCOUNTER — Encounter: Payer: Self-pay | Admitting: Advanced Practice Midwife

## 2016-02-12 ENCOUNTER — Encounter: Payer: Medicaid Other | Admitting: Advanced Practice Midwife

## 2016-02-19 ENCOUNTER — Encounter: Payer: Self-pay | Admitting: Advanced Practice Midwife

## 2016-02-19 ENCOUNTER — Ambulatory Visit (INDEPENDENT_AMBULATORY_CARE_PROVIDER_SITE_OTHER): Payer: Self-pay | Admitting: Advanced Practice Midwife

## 2016-02-19 VITALS — BP 128/80 | HR 78 | Ht 64.0 in | Wt 176.0 lb

## 2016-02-19 DIAGNOSIS — Z3040 Encounter for surveillance of contraceptives, unspecified: Secondary | ICD-10-CM

## 2016-02-19 DIAGNOSIS — Z3202 Encounter for pregnancy test, result negative: Secondary | ICD-10-CM

## 2016-02-19 DIAGNOSIS — Z30017 Encounter for initial prescription of implantable subdermal contraceptive: Secondary | ICD-10-CM | POA: Insufficient documentation

## 2016-02-19 DIAGNOSIS — Z3049 Encounter for surveillance of other contraceptives: Secondary | ICD-10-CM

## 2016-02-19 LAB — POCT URINE PREGNANCY: Preg Test, Ur: NEGATIVE

## 2016-02-19 NOTE — Assessment & Plan Note (Signed)
02/19/16   

## 2016-02-19 NOTE — Progress Notes (Signed)
  HPI:  Fredia BeetsKayleigh G Votta is a 30 y.o. year old  female here for Nexplanon insertion.  She has been absinthe since delivery, and her pregnancy test today was negative.  Risks/benefits/side effects of Nexplanon have been discussed and her questions have been answered.  Specifically, a failure rate of 06/998 has been reported, with an increased failure rate if pt takes St. John's Wort and/or antiseizure medicaitons.  Fredia BeetsKayleigh G Ormond is aware of the common side effect of irregular bleeding, which the incidence of decreases over time.   Past Medical History: Past Medical History:  Diagnosis Date  . Seropositive for herpes simplex 2 infection 2010    Past Surgical History: Past Surgical History:  Procedure Laterality Date  . eye implants  2015  . FOOT SURGERY  2013    Family History: Family History  Problem Relation Age of Onset  . Hypertension Father   . Hypertension Mother   . Hypothyroidism Mother   . Hypothyroidism Sister   . Hypertension Maternal Grandmother   . Cancer Maternal Grandmother   . Hypothyroidism Maternal Grandmother   . Hypertension Maternal Grandfather   . Hypertension Paternal Grandmother   . Hypertension Paternal Grandfather     Social History: Social History  Substance Use Topics  . Smoking status: Never Smoker  . Smokeless tobacco: Never Used  . Alcohol use No    Allergies:  Allergies  Allergen Reactions  . Latex Itching, Rash and Other (See Comments)    Causes Burning. Pt states specifically latex catheters      Her left arm, approximatly 4 inches proximal from the elbow, was cleansed with alcohol and anesthetized with 2cc of 2% Lidocaine.  The area was cleansed again and the Nexplanon was inserted without difficulty.  A pressure bandage was applied.  Pt was instructed to remove pressure bandage in a few hours, and keep insertion site covered with a bandaid for 3 days.  Back up contraception was recommended for 2 weeks.  Follow-up scheduled PRN  problems  CRESENZO-DISHMAN,Jeremia Groot 02/19/2016 4:41 PM

## 2016-03-17 ENCOUNTER — Telehealth: Payer: Self-pay | Admitting: Advanced Practice Midwife

## 2016-03-17 NOTE — Telephone Encounter (Signed)
Pt c/o irritability since nexplanon insertion, hair loss, BTB. Pt states she would like to discuss with Jacklyn ShellFrances Cresenzo-Dishmon, CNM by phone due to not having insurance at this time.

## 2016-03-19 NOTE — Telephone Encounter (Signed)
LM 03/19/16

## 2017-03-09 ENCOUNTER — Ambulatory Visit (INDEPENDENT_AMBULATORY_CARE_PROVIDER_SITE_OTHER): Payer: Self-pay | Admitting: Advanced Practice Midwife

## 2017-03-09 ENCOUNTER — Encounter: Payer: Self-pay | Admitting: Advanced Practice Midwife

## 2017-03-09 VITALS — BP 110/78 | HR 101 | Wt 155.0 lb

## 2017-03-09 DIAGNOSIS — L0231 Cutaneous abscess of buttock: Secondary | ICD-10-CM

## 2017-03-09 MED ORDER — SULFAMETHOXAZOLE-TRIMETHOPRIM 800-160 MG PO TABS: 1.0000 | ORAL_TABLET | Freq: Two times a day (BID) | ORAL | 0 refills | Status: DC

## 2017-03-09 MED ORDER — SILVER SULFADIAZINE 1 % EX CREA: 1.0000 "application " | TOPICAL_CREAM | Freq: Three times a day (TID) | CUTANEOUS | 1 refills | Status: DC

## 2017-03-09 MED ORDER — HYDROCODONE-ACETAMINOPHEN 5-325 MG PO TABS: 1.0000 | ORAL_TABLET | Freq: Four times a day (QID) | ORAL | 0 refills | Status: DC | PRN

## 2017-03-09 NOTE — Patient Instructions (Addendum)
Boil eze 30  Minutes after Silvadine three times a day

## 2017-03-09 NOTE — Progress Notes (Signed)
[facility] Visit  Patient name: Paula Sims MRN [id number]  Date of birth: [birth year]  CC & HPI:  Paula Sims is a 31 y.o. Caucasian female presenting today for abscess on left buttock. Started about a week ago, thought it was a hemorrhoid because it started near her rectum. Worked on it w/compresses, etc, but has gotten larger and larger and is terribly painful. No health insurance. Going to [employer].   Pertinent History Reviewed:  Medical & Surgical Hx:   Past Medical History:  Diagnosis Date  . Seropositive for herpes simplex 2 infection 2010   Past Surgical History:  Procedure Laterality Date  . eye implants  2015  . FOOT SURGERY  2013   Family History  Problem Relation Age of Onset  . Hypertension Father   . Hypertension Mother   . Hypothyroidism Mother   . Hypothyroidism Sister   . Hypertension Maternal Grandmother   . Cancer Maternal Grandmother   . Hypothyroidism Maternal Grandmother   . Hypertension Maternal Grandfather   . Hypertension Paternal Grandmother   . Hypertension Paternal Grandfather     Current Outpatient Prescriptions:  .  HYDROcodone-acetaminophen (NORCO/VICODIN) 5-325 MG tablet, Take 1 tablet by mouth every 6 (six) hours as needed for moderate pain., Disp: 30 tablet, Rfl: 0 .  silver sulfADIAZINE (SILVADENE) 1 % cream, Apply 1 application topically 3 (three) times daily., Disp: 20 g, Rfl: 1 .  sulfamethoxazole-trimethoprim (BACTRIM DS,SEPTRA DS) 800-160 MG tablet, Take 1 tablet by mouth 2 (two) times daily., Disp: 28 tablet, Rfl: 0 Social History: Reviewed -  reports that she has never smoked. She has never used smokeless tobacco.  Review of Systems:   Constitutional: Negative for fever and chills Eyes: Negative for visual disturbances Respiratory: Negative for shortness of breath, dyspnea Cardiovascular: Negative for chest pain or palpitations  Gastrointestinal: Negative for vomiting, diarrhea and constipation; no  abdominal pain Genitourinary: Negative for dysuria and urgency, vaginal irritation or itching Musculoskeletal: Negative for back pain, joint pain, myalgias  Neurological: Negative for dizziness and headaches    Objective Findings:    Physical Examination: General appearance - well appearing, and in no distress Mental status - alert, oriented to person, place, and time Chest:  Normal respiratory effort Heart - normal rate and regular rhythm Abdomen:  Soft, nontender Buttock:  Left buttock w/mostly discrete firm abscess about the size of a lemon. CoExam w/LHE.  Not drainable right now.  Musculoskeletal:  Normal range of motion without pain Extremities:  No edema    No results found for this or any previous visit (from the past 24 hour(s)).    Assessment & Plan:  A:   Left gluteal abscess P:  Bactrim BID X 14 days  Silvadine cream TID  Boil Eze 30 minutes after silvadine  Vicodin 5/325 q 6 hrs prn pain   Return for monday w/ Dr Despina Hidden                        .  CRESENZO-DISHMAN,Gwendolyn Nishi CNM 03/09/2017 4:23 PM

## 2017-03-12 ENCOUNTER — Encounter: Payer: Self-pay | Admitting: Obstetrics and Gynecology

## 2017-03-12 ENCOUNTER — Ambulatory Visit (INDEPENDENT_AMBULATORY_CARE_PROVIDER_SITE_OTHER): Payer: Self-pay | Admitting: Obstetrics and Gynecology

## 2017-03-12 VITALS — BP 110/60 | HR 80 | Wt 154.6 lb

## 2017-03-12 DIAGNOSIS — L0231 Cutaneous abscess of buttock: Secondary | ICD-10-CM

## 2017-03-12 MED ORDER — METRONIDAZOLE 500 MG PO TABS: 500.0000 mg | ORAL_TABLET | Freq: Two times a day (BID) | ORAL | 0 refills | Status: DC

## 2017-03-12 MED ORDER — HYDROCODONE-ACETAMINOPHEN 5-325 MG PO TABS: 1.0000 | ORAL_TABLET | Freq: Four times a day (QID) | ORAL | 0 refills | Status: DC | PRN

## 2017-03-12 NOTE — Progress Notes (Addendum)
Patient ID: Paula Sims, female   DOB: 1985-11-09, 31 y.o.   MRN: [id number]   [facility] Visit  03/12/17           Patient name: Paula Sims MRN [id number]  Date of birth: December 27, 1985  CC & HPI:  Paula Sims is a 31 y.o. female presenting today for a left gluteal abscess onset 1.5 weeks. Pt reports that the area hasn't drained yet Pt notes that she tried bactrim BID x 14 days, silvadene cream TID, Boil Eze, Rx Vicodin 5-325 q 6 hours or PRN, with no relief of her symptoms. She denies any other symptoms. Pt is exquisitely tender, "Worse than having a baby"  ROS:  ROS   Pertinent History Reviewed:   Reviewed: Significant for HSV2 Medical         Past Medical History:  Diagnosis Date  . Seropositive for herpes simplex 2 infection 2010                              Surgical Hx:    Past Surgical History:  Procedure Laterality Date  . eye implants  2015  . FOOT SURGERY  2013   Medications: Reviewed & Updated - see associated section                       Current Outpatient Prescriptions:  .  HYDROcodone-acetaminophen (NORCO/VICODIN) 5-325 MG tablet, Take 1 tablet by mouth every 6 (six) hours as needed for moderate pain., Disp: 30 tablet, Rfl: 0 .  silver sulfADIAZINE (SILVADENE) 1 % cream, Apply 1 application topically 3 (three) times daily., Disp: 20 g, Rfl: 1 .  sulfamethoxazole-trimethoprim (BACTRIM DS,SEPTRA DS) 800-160 MG tablet, Take 1 tablet by mouth 2 (two) times daily., Disp: 28 tablet, Rfl: 0   Social History: Reviewed -  reports that she has never smoked. She has never used smokeless tobacco.  Objective Findings:  Vitals: Blood pressure 110/60, pulse 80, weight 154 lb 9.6 oz (70.1 kg), not currently breastfeeding.  Physical Examination:  GU: fluctuant 2 cm mass on left buttock, 4 cm from anus. Verbal consent given to proceed with I&D.   INCISION AND DRAINAGE PROCEDURE NOTE: Patient identification was confirmed and verbal consent was  obtained. This procedure was performed by Tilda Burrow, MD at 1:48 PM. Site: left buttock Sterile procedures observed: Yes Needle size: 25 gauge Anesthetic used (type and amt): 1% lidocaine without epinephrine Blade size: 15 Drainage: 30 ccs of purulent material Complexity: Complex Packing used: No Site anesthetized, incision made over site, wound drained and explored loculations, rinsed with copious amounts of normal saline, wound packed with sterile gauze, covered with dry, sterile dressing. Pt tolerated procedure well without complications. Instructions for care discussed verbally and pt provided with additional written instructions for homecare and f/u.   Assessment & Plan:   A:  1. Peri-rectal abscess 2. Renew Vicodin P:  1. Follow up in 5 days for recheck  RENEW VICODIN   By signing my name below, I, Soijett Blue, attest that this documentation has been prepared under the direction and in the presence of Tilda Burrow, MD Electronically Signed: Soijett Blue, ED Scribe. 03/12/17. 1:30 PM.  I personally performed the services described in this documentation, which was SCRIBED in my presence. The recorded information has been reviewed and considered accurate. It has been edited as necessary during review. Tilda Burrow, MD

## 2017-03-12 NOTE — Patient Instructions (Signed)
Incision and Drainage, Care After  Refer to this sheet in the next few weeks. These instructions provide you with information about caring for yourself after your procedure. Your health care provider may also give you more specific instructions. Your treatment has been planned according to current medical practices, but problems sometimes occur. Call your health care provider if you have any problems or questions after your procedure.  What can I expect after the procedure?  After the procedure, it is common to have:  · Pain or discomfort around your incision site.  · Drainage from your incision.     Follow these instructions at home:  ·   · Take over-the-counter and prescription medicines only as told by your health care provider.  · If you were prescribed an antibiotic medicine, take it as told by your health care provider. Do not stop taking the antibiotic even if you start to feel better.  · Follow instructions from your health care provider about:  ? How to take care of your incision.  ? When and how you should change your packing and bandage (dressing). Wash your hands with soap and water before you change your dressing. If soap and water are not available, use hand sanitizer.  ? When you should remove your dressing.  · Do not take baths, swim, or use a hot tub until your health care provider approves.  · Keep all follow-up visits as told by your health care provider. This is important.  · Check your incision area every day for signs of infection. Check for:  ? More redness, swelling, or pain.  ? More fluid or blood.  ? Warmth.  ? Pus or a bad smell.  Contact a health care provider if:  · Your cyst or abscess returns.  · You have a fever.  · You have more redness, swelling, or pain around your incision.  · You have more fluid or blood coming from your incision.  · Your incision feels warm to the touch.  · You have pus or a bad smell coming from your incision.  Get help right away if:  · You have severe pain or  bleeding.  · You cannot eat or drink without vomiting.  · You have decreased urine output.  · You become short of breath.  · You have chest pain.  · You cough up blood.  · The area where the incision and drainage occurred becomes numb or it tingles.  This information is not intended to replace advice given to you by your health care provider. Make sure you discuss any questions you have with your health care provider.  Document Released: 08/24/2011 Document Revised: 11/01/2015 Document Reviewed: 03/22/2015  Elsevier Interactive Patient Education © 2017 Elsevier Inc.   

## 2017-03-15 ENCOUNTER — Ambulatory Visit: Payer: Self-pay | Admitting: Obstetrics & Gynecology

## 2017-03-17 ENCOUNTER — Ambulatory Visit (INDEPENDENT_AMBULATORY_CARE_PROVIDER_SITE_OTHER): Payer: Self-pay | Admitting: Obstetrics and Gynecology

## 2017-03-17 ENCOUNTER — Encounter: Payer: Self-pay | Admitting: Obstetrics and Gynecology

## 2017-03-17 VITALS — BP 110/74 | HR 77 | Ht 64.0 in | Wt 155.0 lb

## 2017-03-17 DIAGNOSIS — K5909 Other constipation: Secondary | ICD-10-CM

## 2017-03-17 DIAGNOSIS — K611 Rectal abscess: Secondary | ICD-10-CM

## 2017-03-17 DIAGNOSIS — N816 Rectocele: Secondary | ICD-10-CM

## 2017-03-17 NOTE — Progress Notes (Signed)
Patient ID: Paula Sims, female   DOB: Jan 03, 1986, 31 y.o.   MRN: [id number]   [facility] Clinic Visit  @            Patient name: Paula Sims MRN [id number]  Date of birth: 17-Nov-1985  CC & HPI:  Paula Sims is a 31 y.o. female presenting today for a recheck s/p an incision and drainage procedure that was done to her left buttockSuspected perirectal abscess, on Friday, 03/12/2017. She reports that at first it drained very little. She took long warm bathes each day since her procedure. On Monday, 03/15/17, while in the bath the area drained a lot. She notes using a panty-liner, however, it continued to drain and woke her up from her sleep. It hasn't drained much since. She is wondering if it is related to her bowel movements. Pt has a hard time with BM since giving birth to her son last June 2017. She has tried taking a stool softener and eating a lot of fiber without relief. Pt denies any other associated symptoms at this time.   The abscess. Draining after couple days reaccumulated RhoGAM again and drained and now is sealed back again there is a small area of fluctuance just inferior and to the buttock side of the incision site, 2 cm to 2 cm with slight erythema over the site of fluctuance will refer to general surgeon for possible surgical procedure ROS:  ROS  (+) constipation All systems are negative except as noted in the HPI and PMH.   Pertinent History Reviewed:   Reviewed:  Medical         Past Medical History:  Diagnosis Date  . Seropositive for herpes simplex 2 infection 2010                              Surgical Hx:    Past Surgical History:  Procedure Laterality Date  . eye implants  2015  . FOOT SURGERY  2013   Medications: Reviewed & Updated - see associated section                       Current Outpatient Prescriptions:  .  HYDROcodone-acetaminophen (NORCO/VICODIN) 5-325 MG tablet, Take 1 tablet by mouth every 6 (six) hours as needed for moderate  pain., Disp: 30 tablet, Rfl: 0 .  metroNIDAZOLE (FLAGYL) 500 MG tablet, Take 1 tablet (500 mg total) by mouth 2 (two) times daily., Disp: 14 tablet, Rfl: 0 .  silver sulfADIAZINE (SILVADENE) 1 % cream, Apply 1 application topically 3 (three) times daily., Disp: 20 g, Rfl: 1 .  sulfamethoxazole-trimethoprim (BACTRIM DS,SEPTRA DS) 800-160 MG tablet, Take 1 tablet by mouth 2 (two) times daily., Disp: 28 tablet, Rfl: 0   Social History: Reviewed -  reports that she has never smoked. She has never used smokeless tobacco.  Objective Findings:  Vitals: not currently breastfeeding.  Physical Examination: General appearance - alert, well appearing, and in no distress and oriented to person, place, and time RECTAL: 2cm X 2cm loculation, persistent perirectal abscess, rectocele  Assessment & Plan:   A:  1. Persistent Perirectal abscess 2. Rectocele 3. Chronic constipation due to rectocele  P:  1. Pt was referred to Dr. Henreitta Leber for an I& D and has an appointment tomorrow, 03/18/17 @ 2 pm To follow-up in our office after perirectal abscesses addressed and we will plan for rectocele repair  By signing  my name below, I, Diona Browner, attest that this documentation has been prepared under the direction and in the presence of Tilda Burrow, MD. Electronically Signed: Diona Browner, Medical Scribe. 03/17/17. 1:44 PM.  I personally performed the services described in this documentation, which was SCRIBED in my presence. The recorded information has been reviewed and considered accurate. It has been edited as necessary during review. Tilda Burrow, MD

## 2017-03-18 ENCOUNTER — Ambulatory Visit (INDEPENDENT_AMBULATORY_CARE_PROVIDER_SITE_OTHER): Payer: Self-pay | Admitting: General Surgery

## 2017-03-18 ENCOUNTER — Encounter: Payer: Self-pay | Admitting: General Surgery

## 2017-03-18 VITALS — BP 116/77 | HR 86 | Temp 98.9°F | Resp 18 | Ht 64.0 in | Wt 156.0 lb

## 2017-03-18 DIAGNOSIS — K610 Anal abscess: Secondary | ICD-10-CM

## 2017-03-18 NOTE — H&P (Addendum)
[facility] History and Physical  Reason for Referral: Perianal abscess Referring Physician: Dr. Emelda Fear, MD     Paula Sims is a 31 y.o. female.  HPI: Paula Sims is a 31 yo pleasant female that has been suffering with pain in her anal area for the past 2-3 weeks.  The about 2-3 weeks ago the pain started and has progressively worsened. She went to the [facility] and saw Dr. Despina Hidden who evaluated and gave her antibiotics and followed back with Dr. Lucianne Muss who drained the area.  Over the weekend, the area drained but then stopped draining. In the last few days, the area opened up itself and a significant amount of purulence was expressed.  The pain is improved now but still very uncomfortable.  She is having difficulty sitting.   It is alleviated with drainage and with offloading.   Past Medical History:  Diagnosis Date  . Seropositive for herpes simplex 2 infection 2010    Past Surgical History:  Procedure Laterality Date  . eye implants  2015  . FOOT SURGERY  2013    Family History  Problem Relation Age of Onset  . Hypertension Father   . Hypertension Mother   . Hypothyroidism Mother   . Hypothyroidism Sister   . Hypertension Maternal Grandmother   . Cancer Maternal Grandmother   . Hypothyroidism Maternal Grandmother   . Hypertension Maternal Grandfather   . Hypertension Paternal Grandmother   . Hypertension Paternal Grandfather     Social History  Substance Use Topics  . Smoking status: Never Smoker  . Smokeless tobacco: Never Used  . Alcohol use No    Medications: I have reviewed the patient's current medications. Allergies as of 03/18/2017      Reactions   Latex Itching, Rash, Other (See Comments)   Causes Burning. Pt states specifically latex catheters      Medication List       Accurate as of 03/18/17  3:32 PM. Always use your most recent med list.          BOIL-EASE EX Apply topically.   HYDROcodone-acetaminophen 5-325 MG  tablet Commonly known as:  NORCO/VICODIN Take 1 tablet by mouth every 6 (six) hours as needed for moderate pain.   metroNIDAZOLE 500 MG tablet Commonly known as:  FLAGYL Take 1 tablet (500 mg total) by mouth 2 (two) times daily.   silver sulfADIAZINE 1 % cream Commonly known as:  SILVADENE Apply 1 application topically 3 (three) times daily.   sulfamethoxazole-trimethoprim 800-160 MG tablet Commonly known as:  BACTRIM DS,SEPTRA DS Take 1 tablet by mouth 2 (two) times daily.        ROS:  A comprehensive review of systems was negative except for: anal pain, drainage  Blood pressure 116/77, pulse 86, temperature 98.9 F (37.2 C), resp. rate 18, height  (1.626 m), weight 156 lb (70.8 kg), not currently breastfeeding. Physical Exam  Constitutional: She is well-developed, well-nourished, and in no distress.  HENT:  Head: Normocephalic and atraumatic.  Eyes: Pupils are equal, round, and reactive to light.  Cardiovascular: Normal rate and regular rhythm.   Pulmonary/Chest: Effort normal and breath sounds normal.  Abdominal: Soft. She exhibits no distension. There is no tenderness.  Genitourinary:  Genitourinary Comments: Left buttock induration and fluctuance with drainage area. Tender in area.     Results: None  Assessment & Plan:  Paula Sims is a 31 y.o. female with a perianal abscess that needs further drainage. Given the duration of the  symptoms and recurrence, I am concerned about a potential fistula formation. We discussed the formation of fistula and abscess, and she has never had anything like this prior.  -I&D of the abscess, and Exam with possible seton placement  -Plan for tomorrow for surgery  -Follow up after surgery   All questions were answered to the satisfaction of the patient.  The risk and benefits of I&D of the abscess and seton were discussed including but not limited to bleeding, infection, need for longtime placement of seton and need for  future surgeries/ referral to colorectal if the seton involves a large amount of sphincter.  After careful consideration, Paula Sims has decided to proceed with surgery.    Paula Sims 03/18/2017, 3:32 PM

## 2017-03-18 NOTE — Patient Instructions (Signed)
Follow up for surgery tomorrow for exam under anesthesia, drainage of abscess and possible seton placement.

## 2017-03-18 NOTE — Progress Notes (Signed)
[facility] History and Physical  Reason for Referral: Perianal abscess Referring Physician: Dr. Ferguson, MD     Elisea G Maclin is a 31 y.o. female.  HPI: Ms. Rhoads is a 31 yo pleasant female that has been suffering with pain in her anal area for the past 2-3 weeks.  The about 2-3 weeks ago the pain started and has progressively worsened. She went to the ObGyn and saw Dr. Eure who evaluated and gave her antibiotics and followed back with Dr. Ferugson who drained the area.  Over the weekend, the area drained but then stopped draining. In the last few days, the area opened up itself and a significant amount of purulence was expressed.  The pain is improved now but still very uncomfortable.  She is having difficulty sitting.   It is alleviated with drainage and with offloading.   Past Medical History:  Diagnosis Date  . Seropositive for herpes simplex 2 infection 2010    Past Surgical History:  Procedure Laterality Date  . eye implants  2015  . FOOT SURGERY  2013    Family History  Problem Relation Age of Onset  . Hypertension Father   . Hypertension Mother   . Hypothyroidism Mother   . Hypothyroidism Sister   . Hypertension Maternal Grandmother   . Cancer Maternal Grandmother   . Hypothyroidism Maternal Grandmother   . Hypertension Maternal Grandfather   . Hypertension Paternal Grandmother   . Hypertension Paternal Grandfather     Social History  Substance Use Topics  . Smoking status: Never Smoker  . Smokeless tobacco: Never Used  . Alcohol use No    Medications: I have reviewed the patient's current medications. Allergies as of 03/18/2017      Reactions   Latex Itching, Rash, Other (See Comments)   Causes Burning. Pt states specifically latex catheters      Medication List       Accurate as of 03/18/17  3:32 PM. Always use your most recent med list.          BOIL-EASE EX Apply topically.   HYDROcodone-acetaminophen 5-325 MG  tablet Commonly known as:  NORCO/VICODIN Take 1 tablet by mouth every 6 (six) hours as needed for moderate pain.   metroNIDAZOLE 500 MG tablet Commonly known as:  FLAGYL Take 1 tablet (500 mg total) by mouth 2 (two) times daily.   silver sulfADIAZINE 1 % cream Commonly known as:  SILVADENE Apply 1 application topically 3 (three) times daily.   sulfamethoxazole-trimethoprim 800-160 MG tablet Commonly known as:  BACTRIM DS,SEPTRA DS Take 1 tablet by mouth 2 (two) times daily.        ROS:  A comprehensive review of systems was negative except for: anal pain, drainage  Blood pressure 116/77, pulse 86, temperature 98.9 F (37.2 C), resp. rate 18, height 5' 4" (1.626 m), weight 156 lb (70.8 kg), not currently breastfeeding. Physical Exam  Constitutional: She is well-developed, well-nourished, and in no distress.  HENT:  Head: Normocephalic and atraumatic.  Eyes: Pupils are equal, round, and reactive to light.  Cardiovascular: Normal rate and regular rhythm.   Pulmonary/Chest: Effort normal and breath sounds normal.  Abdominal: Soft. She exhibits no distension. There is no tenderness.  Genitourinary:  Genitourinary Comments: Left buttock induration and fluctuance with drainage area. Tender in area.     Results: None  Assessment & Plan:  Cassidey G Sramek is a 31 y.o. female with a perianal abscess that needs further drainage. Given the duration of the   symptoms and recurrence, I am concerned about a potential fistula formation. We discussed the formation of fistula and abscess, and she has never had anything like this prior.  -I&D of the abscess, and Exam with possible seton placement  -Plan for tomorrow for surgery  -Follow up after surgery   All questions were answered to the satisfaction of the patient.  The risk and benefits of I&D of the abscess and seton were discussed including but not limited to bleeding, infection, need for longtime placement of seton and need for  future surgeries/ referral to colorectal if the seton involves a large amount of sphincter.  After careful consideration, Copeland G Minar has decided to proceed with surgery.    Lindsay C Bridges 03/18/2017, 3:32 PM  

## 2017-03-19 ENCOUNTER — Ambulatory Visit (HOSPITAL_COMMUNITY): Payer: Self-pay | Admitting: Anesthesiology

## 2017-03-19 ENCOUNTER — Ambulatory Visit (HOSPITAL_COMMUNITY)
Admission: RE | Admit: 2017-03-19 | Discharge: 2017-03-19 | Disposition: A | Discharge status: Home / Self Care | Payer: Self-pay | Source: Ambulatory Visit | Attending: General Surgery | Admitting: General Surgery

## 2017-03-19 ENCOUNTER — Encounter (HOSPITAL_COMMUNITY)
Admission: RE | Disposition: A | Discharge status: Home / Self Care | Payer: Self-pay | Source: Ambulatory Visit | Attending: General Surgery

## 2017-03-19 ENCOUNTER — Encounter (HOSPITAL_COMMUNITY): Payer: Self-pay | Admitting: *Deleted

## 2017-03-19 DIAGNOSIS — Z809 Family history of malignant neoplasm, unspecified: Secondary | ICD-10-CM | POA: Insufficient documentation

## 2017-03-19 DIAGNOSIS — Z8249 Family history of ischemic heart disease and other diseases of the circulatory system: Secondary | ICD-10-CM | POA: Insufficient documentation

## 2017-03-19 DIAGNOSIS — K648 Other hemorrhoids: Secondary | ICD-10-CM | POA: Insufficient documentation

## 2017-03-19 DIAGNOSIS — K6139 Other ischiorectal abscess: Secondary | ICD-10-CM

## 2017-03-19 DIAGNOSIS — Z91040 Latex allergy status: Secondary | ICD-10-CM | POA: Insufficient documentation

## 2017-03-19 DIAGNOSIS — Z8349 Family history of other endocrine, nutritional and metabolic diseases: Secondary | ICD-10-CM | POA: Insufficient documentation

## 2017-03-19 DIAGNOSIS — K603 Anal fistula: Secondary | ICD-10-CM | POA: Insufficient documentation

## 2017-03-19 DIAGNOSIS — K644 Residual hemorrhoidal skin tags: Secondary | ICD-10-CM | POA: Insufficient documentation

## 2017-03-19 HISTORY — PX: INCISION AND DRAINAGE PERIRECTAL ABSCESS: SHX1804

## 2017-03-19 LAB — HCG, SERUM, QUALITATIVE: Preg, Serum: NEGATIVE

## 2017-03-19 LAB — CBC
HEMATOCRIT: 34 % — AB (ref 36.0–46.0)
Hemoglobin: 11.1 g/dL — ABNORMAL LOW (ref 12.0–15.0)
MCH: 26.2 pg (ref 26.0–34.0)
MCHC: 32.6 g/dL (ref 30.0–36.0)
MCV: 80.2 fL (ref 78.0–100.0)
Platelets: 284 10*3/uL (ref 150–400)
RBC: 4.24 MIL/uL (ref 3.87–5.11)
RDW: 13.5 % (ref 11.5–15.5)
WBC: 7.6 10*3/uL (ref 4.0–10.5)

## 2017-03-19 LAB — BASIC METABOLIC PANEL
Anion gap: 8 (ref 5–15)
BUN: 16 mg/dL (ref 6–20)
CALCIUM: 9.1 mg/dL (ref 8.9–10.3)
CO2: 25 mmol/L (ref 22–32)
CREATININE: 0.78 mg/dL (ref 0.44–1.00)
Chloride: 103 mmol/L (ref 101–111)
GFR calc non Af Amer: >60 mL/min (ref >60)
Glucose, Bld: 94 mg/dL (ref 65–99)
Potassium: 3.6 mmol/L (ref 3.5–5.1)
SODIUM: 136 mmol/L (ref 135–145)

## 2017-03-19 SURGERY — INCISION AND DRAINAGE, ABSCESS, PERIRECTAL
Anesthesia: General

## 2017-03-19 MED ORDER — ONDANSETRON HCL 4 MG/2ML IJ SOLN
4.0000 mg | Freq: Once | INTRAMUSCULAR | Status: AC
  Administered 2017-03-19: 4 mg via INTRAVENOUS

## 2017-03-19 MED ORDER — HYDROGEN PEROXIDE 3 % EX SOLN
CUTANEOUS | Status: DC | PRN
  Administered 2017-03-19: 1

## 2017-03-19 MED ORDER — CHLORHEXIDINE GLUCONATE CLOTH 2 % EX PADS: 6.0000 | MEDICATED_PAD | Freq: Once | CUTANEOUS | Status: DC

## 2017-03-19 MED ORDER — CEFOTETAN DISODIUM-DEXTROSE 2-2.08 GM-% IV SOLR
2.0000 g | INTRAVENOUS | Status: AC
  Administered 2017-03-19: 2 g via INTRAVENOUS
  Filled 2017-03-19: qty 50

## 2017-03-19 MED ORDER — OXYCODONE HCL 5 MG PO TABS: 5.0000 mg | ORAL_TABLET | ORAL | 0 refills | Status: DC | PRN

## 2017-03-19 MED ORDER — FENTANYL CITRATE (PF) 100 MCG/2ML IJ SOLN
INTRAMUSCULAR | Status: AC
  Filled 2017-03-19: qty 2

## 2017-03-19 MED ORDER — FENTANYL CITRATE (PF) 100 MCG/2ML IJ SOLN
25.0000 ug | INTRAMUSCULAR | Status: DC | PRN
  Administered 2017-03-19 (×3): 50 ug via INTRAVENOUS
  Filled 2017-03-19 (×2): qty 2

## 2017-03-19 MED ORDER — ONDANSETRON HCL 4 MG/2ML IJ SOLN
INTRAMUSCULAR | Status: AC
  Filled 2017-03-19: qty 2

## 2017-03-19 MED ORDER — MIDAZOLAM HCL 2 MG/2ML IJ SOLN
INTRAMUSCULAR | Status: AC
  Filled 2017-03-19: qty 2

## 2017-03-19 MED ORDER — DOCUSATE SODIUM 100 MG PO CAPS: 200.0000 mg | ORAL_CAPSULE | Freq: Two times a day (BID) | ORAL | 2 refills | Status: AC

## 2017-03-19 MED ORDER — BUPIVACAINE HCL (PF) 0.5 % IJ SOLN
INTRAMUSCULAR | Status: DC | PRN
  Administered 2017-03-19: 10 mL

## 2017-03-19 MED ORDER — LACTATED RINGERS IV SOLN
INTRAVENOUS | Status: DC
  Administered 2017-03-19: 10:00:00 via INTRAVENOUS

## 2017-03-19 MED ORDER — MIDAZOLAM HCL 2 MG/2ML IJ SOLN
1.0000 mg | INTRAMUSCULAR | Status: AC
  Administered 2017-03-19: 2 mg via INTRAVENOUS

## 2017-03-19 MED ORDER — POLYETHYLENE GLYCOL 3350 17 G PO PACK: 17.0000 g | PACK | Freq: Every day | ORAL | 0 refills | Status: AC | PRN

## 2017-03-19 MED ORDER — FENTANYL CITRATE (PF) 100 MCG/2ML IJ SOLN
25.0000 ug | INTRAMUSCULAR | Status: AC | PRN
  Administered 2017-03-19 (×2): 25 ug via INTRAVENOUS

## 2017-03-19 MED ORDER — BUPIVACAINE HCL (PF) 0.5 % IJ SOLN
INTRAMUSCULAR | Status: AC
  Filled 2017-03-19: qty 30

## 2017-03-19 MED ORDER — FENTANYL CITRATE (PF) 100 MCG/2ML IJ SOLN
INTRAMUSCULAR | Status: DC | PRN
  Administered 2017-03-19: 50 ug via INTRAVENOUS
  Administered 2017-03-19: 100 ug via INTRAVENOUS
  Administered 2017-03-19: 50 ug via INTRAVENOUS

## 2017-03-19 MED ORDER — PROPOFOL 10 MG/ML IV BOLUS
INTRAVENOUS | Status: AC
  Filled 2017-03-19: qty 20

## 2017-03-19 SURGICAL SUPPLY — 28 items
BAG HAMPER (MISCELLANEOUS) ×3 IMPLANT
BNDG GAUZE ELAST 4 BULKY (GAUZE/BANDAGES/DRESSINGS) ×2 IMPLANT
CLOTH BEACON ORANGE TIMEOUT ST (SAFETY) ×3 IMPLANT
COVER LIGHT HANDLE STERIS (MISCELLANEOUS) ×6 IMPLANT
DECANTER SPIKE VIAL GLASS SM (MISCELLANEOUS) ×3 IMPLANT
DRAPE PROXIMA HALF (DRAPES) ×3 IMPLANT
ELECT REM PT RETURN 9FT ADLT (ELECTROSURGICAL) ×3
ELECTRODE REM PT RTRN 9FT ADLT (ELECTROSURGICAL) ×1 IMPLANT
GAUZE SPONGE 4X4 12PLY STRL (GAUZE/BANDAGES/DRESSINGS) ×3 IMPLANT
GAUZE XEROFORM 5X9 LF (GAUZE/BANDAGES/DRESSINGS) ×2 IMPLANT
GLOVE BIOGEL PI IND STRL 6.5 (GLOVE) IMPLANT
GLOVE BIOGEL PI IND STRL 7.0 (GLOVE) ×1 IMPLANT
GLOVE BIOGEL PI INDICATOR 6.5 (GLOVE) ×2
GLOVE BIOGEL PI INDICATOR 7.0 (GLOVE) ×4
GLOVE SURG SS PI 6.5 STRL IVOR (GLOVE) ×2 IMPLANT
GLOVE SURG SS PI 7.0 STRL IVOR (GLOVE) ×2 IMPLANT
GOWN STRL REUS W/TWL LRG LVL3 (GOWN DISPOSABLE) ×6 IMPLANT
KIT ROOM TURNOVER APOR (KITS) ×3 IMPLANT
MANIFOLD NEPTUNE II (INSTRUMENTS) ×3 IMPLANT
NDL HYPO 25X1 1.5 SAFETY (NEEDLE) ×1 IMPLANT
NEEDLE HYPO 25X1 1.5 SAFETY (NEEDLE) ×3 IMPLANT
NS IRRIG 1000ML POUR BTL (IV SOLUTION) ×3 IMPLANT
PACK PERI GYN (CUSTOM PROCEDURE TRAY) ×3 IMPLANT
PAD ABD 5X9 TENDERSORB (GAUZE/BANDAGES/DRESSINGS) ×2 IMPLANT
PAD ARMBOARD 7.5X6 YLW CONV (MISCELLANEOUS) ×3 IMPLANT
SET BASIN LINEN APH (SET/KITS/TRAYS/PACK) ×3 IMPLANT
SYR CONTROL 10ML LL (SYRINGE) ×5 IMPLANT
VESSEL LOOPS MAXI RED (MISCELLANEOUS) ×2 IMPLANT

## 2017-03-19 NOTE — Op Note (Signed)
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 Henreitta Leber, MD   Assistants: No qualified resident was available   Anesthesia: General with LMA    Anesthesiologist: Dr. Jayme Cloud   Specimens: None    Estimated Blood Loss: Minimal   Blood Replacement: None    Complications: None   Wound Class: Dirty    Operative Indications:  Ms. Bloodsaw is a 31 yo with a recurrent perianal abscess that has been treated for 2-3 weeks with antibiotics and I&D at the [facility]. She continues to have pain and drainage and was seen in my office for evaluation. Due to the chronicity, there is concern for fistula formation causing this abscess. After a discussion of the risk and benefits, she opted to proceed with surgery.   Findings:  Left perianal / ischiorectal abscess with 3cm cavity, fistula connection superficially to anterior 1 o'clock position without sphincter involvement  Minimal internal hemorrhoids, small posterior right external hemorrhoid    Procedure: The patient was taken to the operating room and placed in high lithotomy position. General anesthesia was induced.  Intravenous antibiotics were administered per protocol.  The perineal area was prepared and draped in the usual sterile fashion with betadine prep.  A rectal exam was performed and normal tone was noted with no internal fluctuance or findings.  The anoscope was placed and there were no obvious signs of a fistula.  Minimal internal hemorrhoids were noted and a small posterior right external hemorrhoid was noted.  The left ischiorectal/ perianal region was fluctuant with a drain hole.  An angiocatheter with peroxide was introduced and under direct  visualization in the anus with an anoscope, not internal peroxide was seen.  The open was probed with a fistula probed and a superficial fistula tract was noted anterior at the 1 o clock position.  No sphincter was involved in this tract.  The fistula tract was laid open with electrocautery.  The abscess cavity was then probed with no further tracts identified.  A counter incision was made from the initial drainage site out laterally and a vessel loop was placed to allow for further drainage after the fistulotomy site heals.  The vessel loop was secured with 0 silk sutures times two and was freely mobile in the cavity.   Hemostasis was achieved.  The fistulotomy site was packed with xeroform gauze and kerlix and ABD and gauze panties were placed.    All counts were correct at the end of the case. The patient was awakened from anesthesia and extubate without complication.  The patient went to the PACU in stable condition.   Algis Greenhouse, MD [facility] [address] [place], [place] [address] [phone] (office)

## 2017-03-19 NOTE — Anesthesia Procedure Notes (Signed)
Procedure Name: LMA Insertion Date/Time: 03/19/2017 10:09 AM Performed by: Shanon Payor Pre-anesthesia Checklist: Patient identified, Emergency Drugs available, Suction available, Patient being monitored and Timeout performed Patient Re-evaluated:Patient Re-evaluated prior to induction Oxygen Delivery Method: Circle system utilized Preoxygenation: Pre-oxygenation with 100% oxygen Induction Type: IV induction LMA: LMA inserted LMA Size: 3.0 Number of attempts: 1 Placement Confirmation: positive ETCO2 and breath sounds checked- equal and bilateral Dental Injury: Teeth and Oropharynx as per pre-operative assessment

## 2017-03-19 NOTE — Transfer of Care (Signed)
Immediate Anesthesia Transfer of Care Note  Patient: Paula Sims  Procedure(s) Performed: DRAINAGE  PERIANAL ABSCESS WITH CATHETER PLACEMENT AND FISTULOTOMY (N/A )  Patient Location: PACU  Anesthesia Type:General  Level of Consciousness: awake, alert  and oriented  Airway & Oxygen Therapy: Patient Spontanous Breathing  Post-op Assessment: Report given to RN and Post -op Vital signs reviewed and stable  Post vital signs: Reviewed and stable  Last Vitals:  Vitals:   03/19/17 0955 03/19/17 1000  BP: 112/77 117/78  Pulse:    Resp: 11 17  Temp:    SpO2: 99% 99%    Last Pain:  Vitals:   03/19/17 0914  TempSrc: (P) Oral  PainSc:       Patients Stated Pain Goal: 6 (03/19/17 0913)  Complications: No apparent anesthesia complications

## 2017-03-19 NOTE — Anesthesia Preprocedure Evaluation (Signed)
Anesthesia Evaluation  Patient identified by MRN, date of birth, ID band Patient awake    Reviewed: Allergy & Precautions, NPO status , Patient's Chart, lab work & pertinent test results  Airway Mallampati: II  TM Distance: >3 FB Neck ROM: Full    Dental  (+) Teeth Intact   Pulmonary neg pulmonary ROS,    breath sounds clear to auscultation       Cardiovascular negative cardio ROS   Rhythm:Regular Rate:Normal     Neuro/Psych negative neurological ROS  negative psych ROS   GI/Hepatic negative GI ROS, Neg liver ROS,   Endo/Other  negative endocrine ROS  Renal/GU negative Renal ROS     Musculoskeletal   Abdominal   Peds  Hematology   Anesthesia Other Findings   Reproductive/Obstetrics                             Anesthesia Physical Anesthesia Plan  ASA: II  Anesthesia Plan: General   Post-op Pain Management:    Induction: Intravenous  PONV Risk Score and Plan:   Airway Management Planned: LMA  Additional Equipment:   Intra-op Plan:   Post-operative Plan: Extubation in OR  Informed Consent: I have reviewed the patients History and Physical, chart, labs and discussed the procedure including the risks, benefits and alternatives for the proposed anesthesia with the patient or authorized representative who has indicated his/her understanding and acceptance.     Plan Discussed with:   Anesthesia Plan Comments:         Anesthesia Quick Evaluation

## 2017-03-19 NOTE — Anesthesia Postprocedure Evaluation (Signed)
Anesthesia Post Note  Patient: Paula Sims  Procedure(s) Performed: DRAINAGE  PERIANAL ABSCESS WITH CATHETER PLACEMENT AND FISTULOTOMY (N/A )  Patient location during evaluation: PACU Anesthesia Type: General Level of consciousness: awake and alert and oriented Pain management: pain level controlled Vital Signs Assessment: post-procedure vital signs reviewed and stable Respiratory status: spontaneous breathing Cardiovascular status: blood pressure returned to baseline and stable Postop Assessment: adequate PO intake Anesthetic complications: no     Last Vitals:  Vitals:   03/19/17 1000 03/19/17 1100  BP: 117/78 115/74  Pulse:  96  Resp: 17 17  Temp:  36.6 C  SpO2: 99% 100%    Last Pain:  Vitals:   03/19/17 0914  TempSrc: (P) Oral  PainSc:                  Glynn Octave

## 2017-03-19 NOTE — Interval H&P Note (Signed)
History and Physical Interval Note:  03/19/2017 9:17 AM  Paula Sims  has presented today for surgery, with the diagnosis of perianal abscess  The various methods of treatment have been discussed with the patient and family. After consideration of risks, benefits and other options for treatment, the patient has consented to  Procedure(s): IRRIGATION AND DEBRIDEMENT PERIANAL ABSCESS (N/A) PLACEMENT OF SETON (N/A) as a surgical intervention .  The patient's history has been reviewed, patient examined, no change in status, stable for surgery.  I have reviewed the patient's chart and labs.  Questions were answered to the patient's satisfaction.     [name]

## 2017-03-19 NOTE — Discharge Instructions (Signed)
Post Operative Instructions after Perianal abscess drainage and Fistulotomy   -Keep your stools soft and have a BM daily. Take fiber (Metamucil) over the counter daily. Take Colace twice daily if fiber does not keep your stools soft.  Take Miralax as needed if you still have not had a BM in 2 Days. -Take Sitz Baths (warm water baths) at least three times a day and after every BM and when having discomfort.  -Take Ibuprofen and Tylenol alternating and the Narcotic pain medication for breakthrough pain. -Your seton (or drainage catheter) can cause some discomfort but this should improve. Move the seton around and make sure it slides easily everyday. -Some bleeding is normal, but if you have excessive bleeding, fevers, chills, or more pain than prior, call or go to the ED. -Wear a pad for drainage as needed.  -Continue the bactrim for the entire course prescribed. Stop the flagyl.  Stop the silvadene cream. Stop the episome salt in the baths.   Perianal Abscess An abscess is an infected area that is filled with pus. A perianal abscess occurs in the perineum, which is the area between the anus and the scrotum in males and between the anus and the vagina in females. Perianal abscesses can vary in size. Without treatment, a perianal abscess can become larger and cause other problems. What are the causes? This condition is caused by:  Waste from damaged or dead tissue (debris) that plugs up glands in the perineum. When this happens, an abscess may form.  Infections of the perineum.  What are the signs or symptoms? Common symptoms of this condition include:  Swelling and redness in the area of the abscess. The redness may go beyond the abscess and appear as a red streak on the skin.  Pain in the area of the abscess, including pain when sitting, walking, or passing stool.  Other possible symptoms include:  A visible, painful lump, or a lump that can be felt when touched.  Bleeding or pus-like  discharge from the area.  Fever.  General weakness.  How is this diagnosed? This condition is diagnosed based on your medical history and a physical exam of the affected area.  This may involve examining the rectal area with a gloved hand (digital rectal exam).  Sometimes, the health care provider needs to look into the rectum using a probe or a scope.  For women, it may require a careful vaginal exam.  How is this treated? Treatment for this condition may include:  Making a cut (incision) in the abscess to drain the pus. This can sometimes be done in your health care provider's office or an emergency department after you are given medicine to numb the area (local anesthetic).  Surgery to drain the abscess. This is for larger or deeper abscesses.  Antibiotic medicines, if there is infection in the surrounding tissue (cellulitis).  Having gauze packed into the abscess to continue draining the area.  Frequent baths in warm water that is deep enough to cover your hips and buttocks (sitz baths). These help the wound heal and they make the abscess less likely to come back.  Follow these instructions at home: Medicines  Take over-the-counter and prescription medicines for pain, fever, or discomfort only as told by your health care provider.  If you were prescribed an antibiotic medicine, use it as told by your health care provider. Do not stop using the antibiotic even if you start to feel better.  Do not drive or use heavy machinery  while taking prescription pain medicine. Wound care   Keep the skin around the wound clean and dry. Avoid cleaning the area too much.  Avoid scratching the wound.  Avoid using colored or perfumed toilet papers.  Take a sitz bath 3-4 times a day and after bowel movements. This will help reduce pain and swelling.  If directed, apply ice to the injured area: ? Put ice in a plastic bag. ? Place a towel between your skin and the bag. ? Leave the  ice on for 20 minutes, 2-3 times a day.  Check your incision area every day for signs of infection. Check for: ? More redness, swelling, or pain. ? More fluid or blood. ? Warmth. ? Pus or a bad smell. Gauze  If gauze was used in the abscess, follow instructions from your health care provider about removing or changing the gauze. It can usually be removed in 2-3 days.  Wash your hands with soap and water before you remove or change your gauze. If soap and water are not available, use hand sanitizer.  If one or more drains were placed in the abscess cavity, be careful not to pull at them. Your health care provider will tell you how long they need to remain in place. General instructions  Keep all follow-up visits as told by your health care provider. This is important. Contact a health care provider if:  You have trouble passing stool or passing urine.  Your pain or swelling in the affected area does not seem to be getting better.  The gauze packing or the drains come out before the planned time. Get help right away if:  You have problems moving or using your legs.  You have severe or increasing pain.  Your swelling in the affected area suddenly gets worse.  You have a large increase in bleeding or passing of pus.  You have chills or a fever. This information is not intended to replace advice given to you by your health care provider. Make sure you discuss any questions you have with your health care provider. Document Released: 07/08/2006 Document Revised: 12/20/2015 Document Reviewed: 11/11/2015 Elsevier Interactive Patient Education  2018 [employer].  Anal Fistula An anal fistula is an abnormal tunnel that develops between the bowel and the skin near the outside of the anus, where stool (feces) comes out. The anus has many tiny glands that make lubricating fluid. Sometimes, these glands become plugged and infected, and that can cause a fluid-filled pocket (abscess) to  form. An anal fistula often develops after this infection or abscess. What are the causes? In most cases, an anal fistula is caused by a past or current anal abscess. Other causes include:  A complication of surgery.  Trauma to the rectal area.  Radiation to the area.  Medical conditions or diseases, such as: ? Chronic inflammatory bowel disease, such as Crohn disease or ulcerative colitis. ? Colon cancer or rectal cancer. ? Diverticular disease, such as diverticulitis. ? An STD (sexually transmitted disease), such as gonorrhea, chlamydia, or syphilis. ? An infection that is caused by HIV (human immunodeficiency virus). ? Foreign body in the rectum.  What are the signs or symptoms? Symptoms of this condition include:  Throbbing or constant pain that may be worse while you are sitting.  Swelling or irritation around the anus.  Drainage of pus or blood from an opening near the anus.  Pain with bowel movements.  Fever or chills.  How is this diagnosed? Your health care  provider will examine the area to find the openings of the anal fistula and the fistula tract. The external opening of the anal fistula may be seen during a physical exam. You may also have tests, including:  An exam of the rectal area with a gloved hand (digital rectal exam).  An exam with a probe or scope to help locate the internal opening of the fistula.  Imaging tests to find the exact location and path of the fistula. These tests may include X-rays, an ultrasound, a CT scan, or MRI. The path is made visible by a dye that is injected into the fistula opening.  You may have other tests to find the cause of the anal fistula. How is this treated? The most common treatment for an anal fistula is surgery. The type of surgery that is used will depend on where the fistula is located and how complex the fistula is. Surgical options include:  A fistulotomy. The whole fistula is opened up, and the contents are  drained to promote healing.  Seton placement. A silk string (seton) is placed into the fistula during a fistulotomy. This helps to drain any infection to promote healing.  Advancement flap procedure. Tissue is removed from your rectum or the skin around the anus and is attached to the opening of the fistula.  Bioprosthetic plug. A cone-shaped plug is made from your tissue and is used to block the opening of the fistula.  Some anal fistulas do not require surgery. A nonsurgical treatment option involves injecting a fibrin glue to seal the fistula. You also may be prescribed an antibiotic medicine to treat an infection. Follow these instructions at home: Medicines  Take over-the-counter and prescription medicines only as told by your health care provider.  If you were prescribed an antibiotic medicine, take it as told by your health care provider. Do not stop taking the antibiotic even if you start to feel better.  Use a stool softener or a laxative if told to do so by your health care provider. General instructions  Eat a high-fiber diet as told by your health care provider. This can help to prevent constipation.  Drink enough fluid to keep your urine clear or pale yellow.  Take a warm sitz bath for 15-20 minutes, 3-4 times per day, or as told by your health care provider. Sitz baths can ease your pain and discomfort and help with healing.  Follow good hygiene to keep the anal area as clean and dry as possible. Use wet toilet paper or a moist towelette after each bowel movement.  Keep all follow-up visits as told by your health care provider. This is important. Contact a health care provider if:  You have increased pain that is not controlled with medicines.  You have new redness or swelling around the anal area.  You have new fluid, blood, or pus coming from the anal area.  You have tenderness or warmth around the anal area. Get help right away if:  You have a fever.  You  have severe pain.  You have chills or diarrhea.  You have severe problems urinating or having a bowel movement. This information is not intended to replace advice given to you by your health care provider. Make sure you discuss any questions you have with your health care provider. Document Released: 05/14/2008 Document Revised: 11/07/2015 Document Reviewed: 08/27/2014 Elsevier Interactive Patient Education  2018 [employer].    Anal Fistulotomy Anal fistulotomy is a surgical procedure to open and  drain an anal fistula. An anal fistula is an abnormal tunnel that develops between the bowel and the skin near the outside of the anus, where stool (feces) comes out. During this procedure, the fistula is opened up and the contents are drained to promote healing. Tell a health care provider about:  Any allergies you have.  All medicines you are taking, including vitamins, herbs, eye drops, creams, and over-the-counter medicines.  Any problems you or family members have had with anesthetic medicines.  Any blood disorders you have.  Any surgeries you have had.  Any medical conditions you have.  Whether you are pregnant or may be pregnant. What are the risks? Generally, this is a safe procedure. However, problems may occur, including:  Infection.  Bleeding.  Allergic reactions to medicines or dyes.  Damage to other structures or organs.  Not being able to control when you have bowel movements (incontinence).  Being unable to empty your bladder (urinary retention).  What happens before the procedure? Staying hydrated Follow instructions from your health care provider about hydration, which may include:  Up to 2 hours before the procedure - you may continue to drink clear liquids, such as water, clear fruit juice, black coffee, and plain tea.  Eating and drinking restrictions Follow instructions from your health care provider about eating and drinking, which may include:  8  hours before the procedure - stop eating heavy meals or foods such as meat, fried foods, or fatty foods.  6 hours before the procedure - stop eating light meals or foods, such as toast or cereal.  6 hours before the procedure - stop drinking milk or drinks that contain milk.  2 hours before the procedure - stop drinking clear liquids.  Medicines  Ask your health care provider about: ? Changing or stopping your regular medicines. This is especially important if you are taking diabetes medicines or blood thinners. ? Taking medicines such as aspirin and ibuprofen. These medicines can thin your blood. Do not take these medicines before your procedure if your health care provider instructs you not to.  You may be given antibiotic medicine to help prevent infection. General Instructions  You may have an exam or testing.  You may have a blood or urine sample taken.  You may be instructed to take a laxative or use an enema to clean your bowels before surgery.  Plan to have someone take you home from the hospital or clinic.  If you will be going home right after the procedure, plan to have someone with you for 24 hours. What happens during the procedure?  To reduce your risk of infection: ? Your health care team will wash or sanitize their hands. ? Your skin will be washed with soap.  An IV tube will be inserted into one of your veins to give you fluids and medicines.  You will be given one or more of the following: ? A medicine to help you relax (sedative). ? A medicine to numb the area (local anesthetic). ? A medicine to make you fall asleep (general anesthetic).  Your surgeon will locate the internal opening of your fistula.  An incision will be made in the fistula opening. The incision may extend into the muscles around the anus (sphincter muscles).  The fistula will be cut open and drained.  The sides of the fistula may be stitched (sutured) to the sides of the  incision.  Gauze bandages (dressings) may be placed inside of the fistula. The procedure may  vary among health care providers and hospitals. What happens after the procedure?  Your blood pressure, heart rate, breathing rate, and blood oxygen level will be monitored until the medicines you were given have worn off.  You may have to wear compression stockings. These stockings help to prevent blood clots and reduce swelling in your legs.  Do not drive for 24 hours if you received a sedative.  You may continue to receive fluids and medicines through an IV tube.  You may have some bleeding from the surgical area. You may have to wear an absorbent pad. This information is not intended to replace advice given to you by your health care provider. Make sure you discuss any questions you have with your health care provider. Document Released: 09/24/2015 Document Revised: 01/09/2016 Document Reviewed: 09/24/2015 Elsevier Interactive Patient Education  2018 [employer].   PATIENT INSTRUCTIONS POST-ANESTHESIA  IMMEDIATELY FOLLOWING SURGERY:  Do not drive or operate machinery for the first twenty four hours after surgery.  Do not make any important decisions for twenty four hours after surgery or while taking narcotic pain medications or sedatives.  If you develop intractable nausea and vomiting or a severe headache please notify your doctor immediately.  FOLLOW-UP:  Please make an appointment with your surgeon as instructed. You do not need to follow up with anesthesia unless specifically instructed to do so.  WOUND CARE INSTRUCTIONS (if applicable):  Keep a dry clean dressing on the anesthesia/puncture wound site if there is drainage.  Once the wound has quit draining you may leave it open to air.  Generally you should leave the bandage intact for twenty four hours unless there is drainage.  If the epidural site drains for more than 36-48 hours please call the anesthesia department.  QUESTIONS?:   Please feel free to call your physician or the hospital operator if you have any questions, and they will be happy to assist you.

## 2017-03-22 ENCOUNTER — Encounter (HOSPITAL_COMMUNITY): Payer: Self-pay | Admitting: General Surgery

## 2017-03-23 ENCOUNTER — Ambulatory Visit (INDEPENDENT_AMBULATORY_CARE_PROVIDER_SITE_OTHER): Payer: Self-pay | Admitting: General Surgery

## 2017-03-23 ENCOUNTER — Encounter: Payer: Self-pay | Admitting: General Surgery

## 2017-03-23 VITALS — BP 124/68 | HR 84 | Temp 99.3°F | Resp 18 | Ht 64.0 in | Wt 159.0 lb

## 2017-03-23 DIAGNOSIS — Z09 Encounter for follow-up examination after completed treatment for conditions other than malignant neoplasm: Secondary | ICD-10-CM

## 2017-03-23 MED ORDER — OXYCODONE HCL 5 MG PO TABS: 5.0000 mg | ORAL_TABLET | ORAL | 0 refills | Status: AC | PRN

## 2017-03-23 NOTE — Patient Instructions (Signed)
Continue with Sitz Baths. Ibuprofen and tylenol alternating, roxicodone for breakthrough. Follow up next week. Complete antibiotic.

## 2017-03-23 NOTE — Progress Notes (Signed)
[facility] Note   HPI:  31 y.o. Female presents to clinic for post-op follow-up evaluation of ischiorectal abscess drainage and superficial fistulotomy. Patient reports she is doing better and has been having loose BMs and some drainage from the area. Her pain is improving and is at about 3 constantly.  She denies fevers or chills.  Review of Systems:  Drainage Loose BMs All other review of systems: otherwise negative   Vital Signs:  BP 124/68   Pulse 84   Temp 99.3 F (37.4 C)   Resp 18   Ht  (1.626 m)   Wt 159 lb (72.1 kg)   BMI 27.29 kg/m    Physical Exam:  Physical Exam  Constitutional: She is oriented to person, place, and time and well-developed, well-nourished, and in no distress.  Pulmonary/Chest: Effort normal.  Genitourinary:  Genitourinary Comments: Left anterior fistulotomy site clean with swelling, some mucus drainage, catheter in place, moves   Musculoskeletal: Normal range of motion.  Neurological: She is alert and oriented to person, place, and time.    Laboratory studies: None   Imaging:  None   Assessment:  31 y.o. yo Female with ischiorectal abscess s/p drainage with catheter and fistulotomy. Doing fair. Using ibuprofen and tylenol and roxicodone for pain. Taking Sitz baths.  Plan:  - Finish antibiotic  - Continue sitz baths - Roxicodone refill written, still take ibuprofen and tylenol, has 8 from her prior prescription remaining  -  Future Appointments Date Time Provider Department Center  04/01/2017 1:00 PM [name], MD [facility] None     Algis Greenhouse, MD [facility] [address] [place], [place] [address] [phone] (office)

## 2017-03-30 ENCOUNTER — Ambulatory Visit: Payer: Self-pay | Admitting: General Surgery

## 2017-04-01 ENCOUNTER — Ambulatory Visit (INDEPENDENT_AMBULATORY_CARE_PROVIDER_SITE_OTHER): Payer: Self-pay | Admitting: General Surgery

## 2017-04-01 ENCOUNTER — Encounter: Payer: Self-pay | Admitting: General Surgery

## 2017-04-01 VITALS — BP 110/66 | HR 74 | Temp 99.1°F | Resp 18 | Ht 64.0 in | Wt 152.0 lb

## 2017-04-01 DIAGNOSIS — Z09 Encounter for follow-up examination after completed treatment for conditions other than malignant neoplasm: Secondary | ICD-10-CM

## 2017-04-01 MED ORDER — SULFAMETHOXAZOLE-TRIMETHOPRIM 800-160 MG PO TABS: 1.0000 | ORAL_TABLET | Freq: Two times a day (BID) | ORAL | 0 refills | Status: AC

## 2017-04-01 NOTE — Progress Notes (Signed)
[facility] Note   HPI:  31 y.o. Female presents to clinic for post-op follow-up s/p superficial fistulotomy and drainage catheter placement for ischiorectal abscess. She is doing much better. The pain is much improved but the catheter remains uncomfortable/ aggrevating at times. Patient reports some drainage but minimal, denies fevers or chills.  She is having daily BMs with miralax and colace.  Review of Systems:  No fevers or chills Pain improved Minimal drainage  All other review of systems: otherwise negative   Vital Signs:  BP 110/66   Pulse 74   Temp 99.1 F (37.3 C)   Resp 18   Ht 5\' 4"  (1.626 m)   Wt 152 lb (68.9 kg)   BMI 26.09 kg/m    Physical Exam:  Physical Exam  Constitutional: She is well-developed, well-nourished, and in no distress.  Pulmonary/Chest: Effort normal.  Genitourinary: Rectal exam shows no tenderness.  Genitourinary Comments: Left buttock drain in place, moves freely, area of granulation in the most lateral location, no drainage, soft, anus with healing wound from fistulotomy, no redness    Laboratory studies: None   Imaging:  None   Assessment:  31 y.o. yo Female with drainage catheter in left buttock for ischiorectal abscess s/p drainage catheter and superficial fistulotomy. Doing well overall. The drain was removed without issues given improvement and minimal drainage. Plan:  - Discussed risk of recurrence and re-accumulation of the abscess but unlikely given fistulotomy   - Bactrim for 5 days while drain initially out to make sure if any remaining infection can eliminate  - Follow up 2 weeks   All of the above recommendations were discussed with the patient and patient's family, and all of patient's and family's questions were answered to their expressed satisfaction.  Algis GreenhouseLindsay Bridges, MD [facility] [address] [place], [place] [id number] [phone] (office)

## 2017-04-01 NOTE — Patient Instructions (Signed)
Continue Sitz baths. Take bactrim for next 5 days. Follow up in 2 weeks.

## 2017-04-15 ENCOUNTER — Ambulatory Visit: Payer: Self-pay | Admitting: General Surgery

## 2023-09-23 ENCOUNTER — Other Ambulatory Visit: Payer: Self-pay

## 2023-09-23 ENCOUNTER — Emergency Department (HOSPITAL_BASED_OUTPATIENT_CLINIC_OR_DEPARTMENT_OTHER)
Admission: EM | Admit: 2023-09-23 | Discharge: 2023-09-24 | Disposition: A | Discharge status: Home / Self Care | Attending: Emergency Medicine | Admitting: Emergency Medicine

## 2023-09-23 DIAGNOSIS — M791 Myalgia, unspecified site: Secondary | ICD-10-CM | POA: Insufficient documentation

## 2023-09-23 DIAGNOSIS — Z79899 Other long term (current) drug therapy: Secondary | ICD-10-CM | POA: Diagnosis not present

## 2023-09-23 DIAGNOSIS — M7918 Myalgia, other site: Secondary | ICD-10-CM

## 2023-09-23 DIAGNOSIS — R000 Tachycardia, unspecified: Secondary | ICD-10-CM | POA: Diagnosis not present

## 2023-09-23 DIAGNOSIS — Z91040 Latex allergy status: Secondary | ICD-10-CM | POA: Insufficient documentation

## 2023-09-23 NOTE — ED Triage Notes (Signed)
 Pt POV reporting upper back pain that radiates to R shoulder blade that worsens with movement. Similar muscular issues in the past, took baclofen and flexeril tonight with no improvement.

## 2023-09-24 ENCOUNTER — Emergency Department (HOSPITAL_BASED_OUTPATIENT_CLINIC_OR_DEPARTMENT_OTHER)

## 2023-09-24 LAB — BASIC METABOLIC PANEL WITH GFR
Anion gap: 8 (ref 5–15)
BUN: 13 mg/dL (ref 6–20)
CO2: 24 mmol/L (ref 22–32)
Calcium: 9 mg/dL (ref 8.9–10.3)
Chloride: 105 mmol/L (ref 98–111)
Creatinine, Ser: 0.92 mg/dL (ref 0.44–1.00)
GFR, Estimated: >60 mL/min (ref >60)
Glucose, Bld: 90 mg/dL (ref 70–99)
Potassium: 3.9 mmol/L (ref 3.5–5.1)
Sodium: 137 mmol/L (ref 135–145)

## 2023-09-24 LAB — CBC WITH DIFFERENTIAL/PLATELET
Abs Immature Granulocytes: 0.04 10*3/uL (ref 0.00–0.07)
Basophils Absolute: 0.1 10*3/uL (ref 0.0–0.1)
Basophils Relative: 1 %
Eosinophils Absolute: 0.1 10*3/uL (ref 0.0–0.5)
Eosinophils Relative: 1 %
HCT: 37.3 % (ref 36.0–46.0)
Hemoglobin: 12.2 g/dL (ref 12.0–15.0)
Immature Granulocytes: 0 %
Lymphocytes Relative: 21 %
Lymphs Abs: 2.4 10*3/uL (ref 0.7–4.0)
MCH: 26.2 pg (ref 26.0–34.0)
MCHC: 32.7 g/dL (ref 30.0–36.0)
MCV: 80.2 fL (ref 80.0–100.0)
Monocytes Absolute: 0.9 10*3/uL (ref 0.1–1.0)
Monocytes Relative: 8 %
Neutro Abs: 7.7 10*3/uL (ref 1.7–7.7)
Neutrophils Relative %: 69 %
Platelets: 332 10*3/uL (ref 150–400)
RBC: 4.65 MIL/uL (ref 3.87–5.11)
RDW: 13.6 % (ref 11.5–15.5)
WBC: 11.1 10*3/uL — ABNORMAL HIGH (ref 4.0–10.5)
nRBC: 0 % (ref 0.0–0.2)

## 2023-09-24 LAB — D-DIMER, QUANTITATIVE: D-Dimer, Quant: 0.33 ug{FEU}/mL (ref 0.00–0.50)

## 2023-09-24 MED ORDER — METHYLPREDNISOLONE 4 MG PO TBPK: ORAL_TABLET | ORAL | 0 refills | Status: AC

## 2023-09-24 MED ORDER — KETOROLAC TROMETHAMINE 30 MG/ML IJ SOLN
30.0000 mg | Freq: Once | INTRAMUSCULAR | Status: AC
  Administered 2023-09-24: 30 mg via INTRAVENOUS
  Filled 2023-09-24: qty 1

## 2023-09-24 MED ORDER — DIAZEPAM 5 MG PO TABS
5.0000 mg | ORAL_TABLET | Freq: Once | ORAL | Status: AC
  Administered 2023-09-24: 5 mg via ORAL
  Filled 2023-09-24: qty 1

## 2023-09-24 NOTE — ED Provider Notes (Signed)
 [facility] Provider Note   CSN: [id number] Arrival date & time: 09/23/23  2238     History  Chief Complaint  Patient presents with   Back Pain    Paula Sims is a 38 y.o. female.  HPI     This is a 38 year old female who presents with right-sided neck and back pain.  Patient reports that she woke up earlier this week thinking that she had slept wrong.  She states that she has a history of musculoskeletal issues and takes Flexeril and baclofen regularly.  However she has had persistent pain and worsening pain in the right scapula that is sharp.  It is worse with movements.  It is worse with deep breathing.  She has had some chest discomfort as well.  She has not had improvement of her symptoms at home.  Denies numbness or tingling of the arms or legs.  No strokelike symptoms.  Home Medications Prior to Admission medications   Medication Sig Start Date End Date Taking? Authorizing Provider  methylPREDNISolone (MEDROL DOSEPAK) 4 MG TBPK tablet Take as directed on packet 09/24/23  Yes Haley Roza, Mayer Masker, MD  polyethylene glycol (MIRALAX) packet Take 17 g by mouth daily as needed. 03/19/17   [name], MD  sulfamethoxazole-trimethoprim (BACTRIM DS,SEPTRA DS) 800-160 MG tablet Take 1 tablet by mouth 2 (two) times daily. 04/01/17   [name], MD      Allergies    Latex    Review of Systems   Review of Systems  Constitutional:  Negative for fever.  Cardiovascular:  Positive for chest pain.  Gastrointestinal:  Negative for abdominal pain.  Musculoskeletal:  Positive for back pain and neck pain.  All other systems reviewed and are negative.   Physical Exam Updated Vital Signs BP (!) 135/99   Pulse 88   Temp 98.3 F (36.8 C)   Resp 18   Ht 1.626 m (5\' 4" )   Wt 81.6 kg   LMP 09/19/2023 (Exact Date)   SpO2 100%   BMI 30.90 kg/m  Physical Exam Vitals and nursing note reviewed.  Constitutional:       Appearance: She is well-developed.  HENT:     Head: Normocephalic and atraumatic.     Mouth/Throat:     Mouth: Mucous membranes are moist.  Eyes:     Pupils: Pupils are equal, round, and reactive to light.  Neck:     Comments: Tenderness palpation right neck and trap area Cardiovascular:     Rate and Rhythm: Normal rate and regular rhythm.     Heart sounds: Normal heart sounds.  Pulmonary:     Effort: Pulmonary effort is normal. No respiratory distress.     Breath sounds: No wheezing.  Abdominal:     Palpations: Abdomen is soft.  Musculoskeletal:     Cervical back: Normal range of motion and neck supple.     Comments: No reproducible tenderness to palpation along the scapula  Skin:    General: Skin is warm and dry.  Neurological:     Mental Status: She is alert and oriented to person, place, and time.  Psychiatric:        Mood and Affect: Mood normal.     ED Results / Procedures / Treatments   Labs (all labs ordered are listed, but only abnormal results are displayed) Labs Reviewed  CBC WITH DIFFERENTIAL/PLATELET - Abnormal; Notable for the following components:      Result Value  WBC 11.1 (*)    All other components within normal limits  BASIC METABOLIC PANEL WITH GFR  D-DIMER, QUANTITATIVE    EKG EKG Interpretation Date/Time:  Friday September 24 2023 00:43:31 EDT Ventricular Rate:  83 PR Interval:  158 QRS Duration:  91 QT Interval:  375 QTC Calculation: 441 R Axis:   87  Text Interpretation: Sinus rhythm Borderline T abnormalities, anterior leads Confirmed by Ross Marcus ([id number]) on 09/24/2023 1:30:28 AM  Radiology DG Chest Portable 1 View Result Date: 09/24/2023 CLINICAL DATA:  Chest pain. EXAM: PORTABLE CHEST 1 VIEW COMPARISON:  June 23, 2009 FINDINGS: The heart size and mediastinal contours are within normal limits. Low lung volumes are noted with subsequent crowding of the bibasilar bronchovascular lung markings. There is no evidence of an acute  infiltrate, pleural effusion or pneumothorax. The visualized skeletal structures are unremarkable. IMPRESSION: Low lung volumes without evidence of acute or active cardiopulmonary disease. Electronically Signed   By: Aram Candela M.D.   On: 09/24/2023 01:10    Procedures Procedures    Medications Ordered in ED Medications  ketorolac (TORADOL) 30 MG/ML injection 30 mg (30 mg Intravenous Given 09/24/23 0041)  diazepam (VALIUM) tablet 5 mg (5 mg Oral Given 09/24/23 0035)    ED Course/ Medical Decision Making/ A&P                                 Medical Decision Making Amount and/or Complexity of Data Reviewed Labs: ordered. Radiology: ordered.  Risk Prescription drug management.   This patient presents to the ED for concern of neck and back pain, this involves an extensive number of treatment options, and is a complaint that carries with it a high risk of complications and morbidity.  I considered the following differential and admission for this acute, potentially life threatening condition.  The differential diagnosis includes musculoskeletal etiology such as muscle spasm, radiculopathy, given chest pain PE is a consideration  MDM:    This is a 38 year old female who presents with neck and back symptoms.  Likely consistent with her prior musculoskeletal etiologies.  However she describes some chest discomfort as well.  She is mildly tachycardic and pain is somewhat worse with breathing.  For this reason, labs were obtained including screening D-dimer for PE.  EKG does not show any evidence of acute ischemia or arrhythmia.  Chest x-ray without pneumothorax or pneumonia.  No obvious masses.  Basic lab work including CBC, BMP, troponin x 2 negative.  Patient significantly improved with Toradol and Valium.  Recommend a Medrol Dosepak for likely musculoskeletal etiology.  (Labs, imaging, consults)  Labs: I Ordered, and personally interpreted labs.  The pertinent results include: CBC,  BMP, troponin x 2  Imaging Studies ordered: I ordered imaging studies including chest x-ray I independently visualized and interpreted imaging. I agree with the radiologist interpretation  Additional history obtained from chart review.  External records from outside source obtained and reviewed including prior evaluations  Cardiac Monitoring: The patient was maintained on a cardiac monitor.  If on the cardiac monitor, I personally viewed and interpreted the cardiac monitored which showed an underlying rhythm of: Sinus  Reevaluation: After the interventions noted above, I reevaluated the patient and found that they have :improved  Social Determinants of Health:  lives independently  Disposition: Discharge  Co morbidities that complicate the patient evaluation  Past Medical History:  Diagnosis Date   Seropositive for herpes simplex 2 infection  2010     Medicines Meds ordered this encounter  Medications   ketorolac (TORADOL) 30 MG/ML injection 30 mg   diazepam (VALIUM) tablet 5 mg   methylPREDNISolone (MEDROL DOSEPAK) 4 MG TBPK tablet    Sig: Take as directed on packet    Dispense:  21 tablet    Refill:  0    I have reviewed the patients home medicines and have made adjustments as needed  Problem List / ED Course: Problem List Items Addressed This Visit   None Visit Diagnoses       Musculoskeletal pain    -  Primary                   Final Clinical Impression(s) / ED Diagnoses Final diagnoses:  Musculoskeletal pain    Rx / DC Orders ED Discharge Orders          Ordered    methylPREDNISolone (MEDROL DOSEPAK) 4 MG TBPK tablet        09/24/23 0226              Shon Baton, MD 09/24/23 [phone]

## 2023-09-24 NOTE — ED Notes (Signed)
 Pain has improved, states it is  more dull now 3/10 Labs WNLs

## 2023-09-24 NOTE — ED Notes (Addendum)
 Pt is here tonight r/t upper back and shoulder pain (shoulder blade) States it hurts to move her head and to take a deep breath Has taken baclofen and flexeril PTA and has had no relief Mom at bedside

## 2023-09-24 NOTE — Discharge Instructions (Signed)
 You were seen today for neck, back, chest pain.  Your workup today is largely reassuring.  This is likely musculoskeletal.  Start a Medrol Dosepak.  Continue your Flexeril and baclofen.
